# Patient Record
Sex: Female | Born: 1935 | Race: White | Hispanic: No | Smoking: Former smoker
Health system: Southern US, Community
[De-identification: ages and names within clinical notes are randomized; demographics above are authoritative.]

## PROBLEM LIST (undated history)

## (undated) DIAGNOSIS — E039 Hypothyroidism, unspecified: Secondary | ICD-10-CM

## (undated) DIAGNOSIS — H02139 Senile ectropion of unspecified eye, unspecified eyelid: Secondary | ICD-10-CM

## (undated) DIAGNOSIS — G459 Transient cerebral ischemic attack, unspecified: Secondary | ICD-10-CM

## (undated) DIAGNOSIS — I10 Essential (primary) hypertension: Secondary | ICD-10-CM

## (undated) DIAGNOSIS — E119 Type 2 diabetes mellitus without complications: Secondary | ICD-10-CM

## (undated) DIAGNOSIS — H02409 Unspecified ptosis of unspecified eyelid: Secondary | ICD-10-CM

## (undated) DIAGNOSIS — H16001 Unspecified corneal ulcer, right eye: Secondary | ICD-10-CM

## (undated) DIAGNOSIS — F329 Major depressive disorder, single episode, unspecified: Secondary | ICD-10-CM

## (undated) DIAGNOSIS — D496 Neoplasm of unspecified behavior of brain: Secondary | ICD-10-CM

## (undated) DIAGNOSIS — I259 Chronic ischemic heart disease, unspecified: Secondary | ICD-10-CM

## (undated) DIAGNOSIS — K219 Gastro-esophageal reflux disease without esophagitis: Secondary | ICD-10-CM

## (undated) DIAGNOSIS — F419 Anxiety disorder, unspecified: Secondary | ICD-10-CM

## (undated) DIAGNOSIS — C801 Malignant (primary) neoplasm, unspecified: Secondary | ICD-10-CM

## (undated) HISTORY — DX: Unspecified ptosis of unspecified eyelid: H02.409

## (undated) HISTORY — DX: Chronic ischemic heart disease, unspecified: I25.9

## (undated) HISTORY — PX: OTHER PROCEDURE: U1053

## (undated) HISTORY — PX: TUBAL LIGATION: SHX77

## (undated) HISTORY — DX: Type 2 diabetes mellitus without complications (CMS-HCC): E11.9

## (undated) HISTORY — DX: Hypothyroidism, unspecified: E03.9

## (undated) HISTORY — PX: CATARACT EXTRACTION W/ INTRAOCULAR LENS  IMPLANT, BILATERAL: SHX1307

## (undated) HISTORY — DX: Gastro-esophageal reflux disease without esophagitis: K21.9

## (undated) HISTORY — DX: Essential (primary) hypertension: I10

## (undated) HISTORY — DX: Major depressive disorder, single episode, unspecified: F32.9

## (undated) HISTORY — DX: Neoplasm of unspecified behavior of brain (CMS-HCC): D49.6

## (undated) HISTORY — DX: Transient cerebral ischemic attack, unspecified: G45.9

## (undated) HISTORY — PX: BELPHAROPTOSIS REPAIR: SHX369

## (undated) HISTORY — DX: Senile ectropion of unspecified eye, unspecified eyelid: H02.139

## (undated) HISTORY — DX: Unspecified corneal ulcer, right eye: H16.001

---

## 1988-06-29 HISTORY — PX: OTHER PROCEDURE: U1053

## 2000-06-29 DIAGNOSIS — G459 Transient cerebral ischemic attack, unspecified: Secondary | ICD-10-CM

## 2000-06-29 HISTORY — DX: Transient cerebral ischemic attack, unspecified: G45.9

## 2012-06-07 ENCOUNTER — Ambulatory Visit (INDEPENDENT_AMBULATORY_CARE_PROVIDER_SITE_OTHER): Payer: TRICARE For Life--Medicare Supplement | Admitting: Ophthalmology

## 2012-08-23 ENCOUNTER — Ambulatory Visit (INDEPENDENT_AMBULATORY_CARE_PROVIDER_SITE_OTHER): Payer: TRICARE For Life--Medicare Supplement | Admitting: Ophthalmology

## 2012-08-30 ENCOUNTER — Ambulatory Visit (INDEPENDENT_AMBULATORY_CARE_PROVIDER_SITE_OTHER): Payer: Medicare Other | Admitting: Ophthalmology

## 2013-09-12 ENCOUNTER — Ambulatory Visit (INDEPENDENT_AMBULATORY_CARE_PROVIDER_SITE_OTHER): Payer: Medicare Other | Admitting: Ophthalmology

## 2013-11-14 ENCOUNTER — Ambulatory Visit (INDEPENDENT_AMBULATORY_CARE_PROVIDER_SITE_OTHER): Payer: Medicare Other | Admitting: Ophthalmology

## 2013-11-21 ENCOUNTER — Ambulatory Visit (INDEPENDENT_AMBULATORY_CARE_PROVIDER_SITE_OTHER): Payer: Medicare Other | Admitting: Ophthalmology

## 2013-11-21 DIAGNOSIS — H02429 Myogenic ptosis of unspecified eyelid: Secondary | ICD-10-CM

## 2013-12-11 ENCOUNTER — Encounter (INDEPENDENT_AMBULATORY_CARE_PROVIDER_SITE_OTHER): Payer: Medicare Other | Admitting: Nurse Practitioner

## 2013-12-14 ENCOUNTER — Ambulatory Visit: Payer: Medicare Other | Attending: Family | Admitting: Family

## 2013-12-14 ENCOUNTER — Encounter (INDEPENDENT_AMBULATORY_CARE_PROVIDER_SITE_OTHER): Payer: Self-pay | Admitting: Family

## 2013-12-14 VITALS — BP 144/69 | HR 64 | Resp 16 | Wt 119.0 lb

## 2013-12-14 DIAGNOSIS — H02409 Unspecified ptosis of unspecified eyelid: Secondary | ICD-10-CM

## 2013-12-14 DIAGNOSIS — H02133 Senile ectropion of right eye, unspecified eyelid: Principal | ICD-10-CM

## 2013-12-14 DIAGNOSIS — H02139 Senile ectropion of unspecified eye, unspecified eyelid: Secondary | ICD-10-CM | POA: Insufficient documentation

## 2013-12-14 MED ORDER — POLYETHYL GLYCOL-PROPYL GLYCOL 0.4-0.3 % OP GEL: OPHTHALMIC | Status: AC

## 2013-12-14 MED ORDER — LEVOTHYROXINE SODIUM 50 MCG OR TABS: 50.00 ug | ORAL_TABLET | Freq: Every day | ORAL | Status: AC

## 2013-12-14 MED ORDER — TRAMADOL HCL 50 MG OR TABS: 50.00 mg | ORAL_TABLET | Freq: Four times a day (QID) | ORAL | Status: AC | PRN

## 2013-12-14 MED ORDER — SIMVASTATIN 20 MG OR TABS: 20.00 mg | ORAL_TABLET | Freq: Every evening | ORAL | Status: AC

## 2013-12-14 MED ORDER — SITAGLIPTIN PHOSPHATE 50 MG OR TABS
50.00 mg | ORAL_TABLET | Freq: Every day | ORAL | Status: DC
Start: ? — End: 2015-05-21

## 2013-12-14 MED ORDER — ESCITALOPRAM OXALATE 5 MG OR TABS: 5.00 mg | ORAL_TABLET | Freq: Every day | ORAL | Status: AC

## 2013-12-14 MED ORDER — ASPIRIN 81 MG OR TABS: 81.00 mg | ORAL_TABLET | Freq: Every day | ORAL | Status: AC

## 2013-12-14 MED ORDER — FOLIC ACID 1 MG OR TABS: 1.00 mg | ORAL_TABLET | Freq: Every day | ORAL | Status: AC

## 2013-12-14 MED ORDER — RANOLAZINE 500 MG OR TB12: 500.00 mg | ORAL_TABLET | Freq: Two times a day (BID) | ORAL | Status: AC

## 2013-12-14 MED ORDER — PANTOPRAZOLE SODIUM 40 MG OR TBEC: 40.00 mg | DELAYED_RELEASE_TABLET | Freq: Every day | ORAL | Status: AC

## 2013-12-14 MED ORDER — LOSARTAN POTASSIUM 50 MG OR TABS: 50.00 mg | ORAL_TABLET | Freq: Every day | ORAL | Status: AC

## 2013-12-14 MED ORDER — POLYETHYL GLYCOL-PROPYL GLYCOL 0.4-0.3 % OP SOLN: OPHTHALMIC | Status: AC

## 2013-12-14 NOTE — H&P (Signed)
Last edited 12/14/13 0958 by Delton Coombes, NP            ANESTHESIA PRE-OPERATIVE EVALUATION    Patient Information    Name: Krystal Berger    MRN: [id number]    DOB: 1932-05-26    Age: 80 year old    Sex: female  Procedure(s):  PTOSIS CORRECTION REPAIR OF EYELIDS 51884 RUL/ CANTHOPLASTY  67950 RUL       BP 144/69   Pulse 64   Resp 16   Wt 53.978 kg (119 lb)   SpO2 97%        Primary language spoken:  English    ROS/Medical History:  General Review & History of Anesthetic Complications:   Patient summary reviewed   No history of anesthetic complications, No PONV, No history of difficult intubation and no family history of anesthetic complications      Cardiovascular:    (+)  hypercholesterolemia  hypertension  CAD angina  cardiac stents unknown 2011      (-) PND, DOE    ECG reviewed   Pulmonary:     (+) shortness of breath (after 2 flights), , , , ,   (-) recent URI Hematology/Oncology:      Neuro/Psych:        (+) , psychiatric history (depression),    TIA: speech & memory slightly impaired.   Infectious Disease:            Endo/Other:      (+) diabetes mellitus type 2, hypothyroidism,  GI/Hepatic:     (+) GERD well controlled,    Renal:    - Negative ROS        Pregnancy History:      Currently pregnant? no       [facility]) Notes:  no beta blocker therapy  [facility] Test & records reviewed by [facility] provider  [facility] Echo/ECG/Angio test available.    Do not take any Diabetes medication including insulin the morning of surgery.Preoperative instructions and education given to patient:  NPO after midnight prior to surgery.  Take all BP and Heart medications on your regular schedule with a small sip of water.  You must have a driver to and from surgery. If you have a post op visit the day after surgery, you will need a driver for that appointment.  Bring all of your eye medications, a pair of dark/protective glasses, your white surgical folder in your blue Shiley bag on the day of surgery and for your post op  appointment.Daughter will assist  :  :              Physical Exam    Airway:  Inter-inciser distance 3-4 cm  Prognanth Unable    Mallampati: III  Neck ROM: full  Short thick neck: No    Cardiovascular:    Rhythm: regular   Rate: normal     Negative for peripheral edema     Pulmonary:       breath sounds clear to auscultation        Neuro/Neck/Skeletal/Skin:  - [place] ANE PHYS EXAM NEGATIVE ROS SKIN SKELETAL NEURO NECK    Positive for Skin abnormalities (ptosi od some facial abnormality since brain surgery)      Dental:    Comment: Upper front capped    Abdominal:   - normal exam         Additional Clinical Notes:   Other Physical Exam Findings: Poor historian not interested in health hx  Snores                      Past Medical History   Diagnosis Date    Senile ectropion     Unspecified ptosis of eyelid     Ischemic heart disease     Brain tumor     Hypertension     Diabetes mellitus     Corneal melt, right     TIA (transient ischemic attack) 2002     x 2    Hypothyroidism     GERD (gastroesophageal reflux disease)     Hypertension     Major depressive disorder, single episode      Past Surgical History   Procedure Laterality Date    Benign brain tumor   1990    Reconstruction face with nerve graft secondary to brain surgery      Cardiac stent type unknown 2001      Cataract extraction w/ intraocular lens  implant, bilateral Bilateral     Biopsy rt breast       Tubal ligation       History   Substance Use Topics    Smoking status: Former Smoker    Smokeless tobacco: Not on file    Alcohol Use: No       Current Outpatient Prescriptions   Medication Sig Dispense Refill    aspirin 81 MG tablet Take 81 mg by mouth daily.      escitalopram (LEXAPRO) 5 MG tablet Take 5 mg by mouth daily.      folic acid (FOLVITE) 1 MG tablet Take 1 mg by mouth daily.      levothyroxine (SYNTHROID) 50 MCG tablet Take 50 mcg by mouth every morning (before breakfast).      losartan (COZAAR) 50 MG tablet Take 50 mg by  mouth daily.      pantoprazole (PROTONIX) 40 MG tablet Take 40 mg by mouth daily.      Polyethyl Glycol-Propyl Glycol (SYSTANE) 0.4-0.3 % GEL       Polyethyl Glycol-Propyl Glycol (SYSTANE) 0.4-0.3 % SOLN       ranolazine (RANEXA) 500 MG Sustained-Release tablet Take 500 mg by mouth 2 times daily.      simvastatin (ZOCOR) 20 MG tablet Take 20 mg by mouth every evening.      sitagliptan (JANUVIA) 50 MG tablet Take 50 mg by mouth daily.      traMADol (ULTRAM) 50 MG tablet Take 50 mg by mouth every 6 hours as needed for Moderate Pain (Pain Score 4-6).       No current facility-administered medications for this visit.     Allergies   Allergen Reactions    Bromday Other     Corneal melt      Codeine Unspecified       Labs and Other Data  No results found for: NA, K, CL, BICARB, BUN, CREAT, GLU, [place]  No results found for: AST, ALT, GGT, LDH, ALK, TP, ALB, TBILI, DBILI  No results found for: WBC, RBC, HGB, HCT, MCV, MCHC, RDW, PLT, MPV, SEG, LYMPHS, MONOS, EOS, BASOS  No results found for: INR, PTT  No results found for: ARTPH, ARTPO2, ARTPCO2    Anesthesia Plan

## 2013-12-14 NOTE — Anesthesia Preprocedure Evaluation (Addendum)
ANESTHESIA PRE-OPERATIVE EVALUATION    Patient Information    Name: Krystal Berger    MRN: [id number]    DOB: 1931/12/20    Age: 80 year old    Sex: female  Procedure(s):  [place] [address] RUL/ CANTHOPLASTY  67950 RUL       BP 144/69   Pulse 64   Resp 16   Wt 53.978 kg (119 lb)   SpO2 97%        Primary language spoken:  English    ROS/Medical History:  General Review & History of Anesthetic Complications:   Patient summary reviewed   No history of anesthetic complications, No PONV, No history of difficult intubation and no family history of anesthetic complications      Cardiovascular:    Exercise tolerance: >4 METS  (+)  hypercholesterolemia  hypertension  CAD angina  cardiac stents unknown 2011      (-) PND, DOE    ECG reviewed   Pulmonary:     (+) shortness of breath (after 2 flights), , , , ,   (-) recent URI Hematology/Oncology:      Neuro/Psych:        (+) TIA (speech & memory slightly impaired), , psychiatric history (depression),      ROS Comments: H/0 brain tumor bening removed in 1990  H/o TIA 5 years ago Infectious Disease:            Endo/Other:      (+) diabetes mellitus type 2, hypothyroidism,  GI/Hepatic:     (+) GERD well controlled,    Renal:    - Negative ROS        Pregnancy History:      Currently pregnant? no       [facility]) Notes:  no beta blocker therapy  [facility] Test & records reviewed by [facility] provider  [facility] Echo/ECG/Angio test available.    Do not take any Diabetes medication including insulin the morning of surgery.Preoperative instructions and education given to patient:  NPO after midnight prior to surgery.  Take all BP and Heart medications on your regular schedule with a small sip of water.  You must have a driver to and from surgery. If you have a post op visit the day after surgery, you will need a driver for that appointment.  Bring all of your eye medications, a pair of dark/protective glasses, your white surgical folder in your blue Shiley bag on the  day of surgery and for your post op appointment.Daughter will assist  :  :      Do not take any Diabetes medication including insulin the morning of surgery.Preoperative instructions and education given to patient:  NPO after midnight prior to surgery.  Take all BP and Heart medications on your regular schedule with a small sip of water.  You must have a driver to and from surgery. If you have a post op visit the day after surgery, you will need a driver for that appointment.  Bring all of your eye medications, a pair of dark/protective glasses, your white surgical folder in your blue Shiley bag on the day of surgery and for your post op appointment.Daughter will assist  :          Physical Exam    Airway:  Inter-inciser distance 3-4 cm  Prognanth Unable    Mallampati: III  Neck ROM: full  Short thick neck: No    Cardiovascular:    Rhythm: regular  Rate: normal     Negative for peripheral edema     Pulmonary:       breath sounds clear to auscultation        Neuro/Neck/Skeletal/Skin:  - [place] ANE PHYS EXAM NEGATIVE ROS SKIN SKELETAL NEURO NECK    Positive for Skin abnormalities (ptosi od some facial abnormality since brain surgery)      Dental:    Comment: Upper front capped    Abdominal:   - normal exam         Additional Clinical Notes:   Other Physical Exam Findings: Poor historian not interested in health hx  Snores                      Past Medical History   Diagnosis Date   • Senile ectropion    • Unspecified ptosis of eyelid    • Ischemic heart disease    • Brain tumor    • Hypertension    • Diabetes mellitus    • Corneal melt, right    • TIA (transient ischemic attack) 2002     x 2   • Hypothyroidism    • GERD (gastroesophageal reflux disease)    • Hypertension    • Major depressive disorder, single episode      Past Surgical History   Procedure Laterality Date   • Benign brain tumor   1990   • Reconstruction face with nerve graft secondary to brain surgery     • Cardiac stent type unknown 2001     • Cataract  extraction w/ intraocular lens  implant, bilateral Bilateral    • Biopsy rt breast      • Tubal ligation       History   Substance Use Topics   • Smoking status: Former Smoker   • Smokeless tobacco: Not on file   • Alcohol Use: No       Current Outpatient Prescriptions   Medication Sig Dispense Refill   • aspirin 81 MG tablet Take 81 mg by mouth daily.     • escitalopram (LEXAPRO) 5 MG tablet Take 5 mg by mouth daily.     • folic acid (FOLVITE) 1 MG tablet Take 1 mg by mouth daily.     • levothyroxine (SYNTHROID) 50 MCG tablet Take 50 mcg by mouth every morning (before breakfast).     • losartan (COZAAR) 50 MG tablet Take 50 mg by mouth daily.     • pantoprazole (PROTONIX) 40 MG tablet Take 40 mg by mouth daily.     • Polyethyl Glycol-Propyl Glycol (SYSTANE) 0.4-0.3 % GEL      • Polyethyl Glycol-Propyl Glycol (SYSTANE) 0.4-0.3 % SOLN      • ranolazine (RANEXA) 500 MG Sustained-Release tablet Take 500 mg by mouth 2 times daily.     • simvastatin (ZOCOR) 20 MG tablet Take 20 mg by mouth every evening.     • sitagliptan (JANUVIA) 50 MG tablet Take 50 mg by mouth daily.     • traMADol (ULTRAM) 50 MG tablet Take 50 mg by mouth every 6 hours as needed for Moderate Pain (Pain Score 4-6).       No current facility-administered medications for this visit.     Allergies   Allergen Reactions   • Bromday Other     Corneal melt     • Codeine Unspecified       Labs and Other Data  No results found for:   NA, K, CL, BICARB, BUN, CREAT, GLU, [place]  No results found for: AST, ALT, GGT, LDH, ALK, TP, ALB, TBILI, DBILI  No results found for: WBC, RBC, HGB, HCT, MCV, MCHC, RDW, PLT, MPV, SEG, LYMPHS, MONOS, EOS, BASOS  No results found for: INR, PTT  No results found for: ARTPH, ARTPO2, ARTPCO2    Anesthesia Plan:  ASA 3 (Severe systemic disease)     Planned induction method: MAC (Sedation)   Planned monitoring method: Routine monitoring  Comments:      Anesthetic plan and risks discussed with Patient.

## 2013-12-18 ENCOUNTER — Encounter (HOSPITAL_BASED_OUTPATIENT_CLINIC_OR_DEPARTMENT_OTHER)
Admission: RE | Disposition: A | Discharge status: Home Health | Payer: Medicare Other | Source: Ambulatory Visit | Attending: Ophthalmology

## 2013-12-18 ENCOUNTER — Ambulatory Visit
Admission: RE | Admit: 2013-12-18 | Discharge: 2013-12-18 | Disposition: A | Discharge status: Home / Self Care | Payer: Medicare Other | Source: Ambulatory Visit | Attending: Ophthalmology | Admitting: Ophthalmology

## 2013-12-18 ENCOUNTER — Encounter (HOSPITAL_BASED_OUTPATIENT_CLINIC_OR_DEPARTMENT_OTHER): Payer: Medicare Other | Admitting: Family

## 2013-12-18 ENCOUNTER — Ambulatory Visit (HOSPITAL_BASED_OUTPATIENT_CLINIC_OR_DEPARTMENT_OTHER): Payer: Medicare Other | Admitting: Ophthalmology

## 2013-12-18 ENCOUNTER — Ambulatory Visit (HOSPITAL_BASED_OUTPATIENT_CLINIC_OR_DEPARTMENT_OTHER): Payer: Medicare Other | Admitting: Anesthesiology

## 2013-12-18 DIAGNOSIS — H02409 Unspecified ptosis of unspecified eyelid: Principal | ICD-10-CM | POA: Insufficient documentation

## 2013-12-18 DIAGNOSIS — H02133 Senile ectropion of right eye, unspecified eyelid: Secondary | ICD-10-CM

## 2013-12-18 DIAGNOSIS — H02109 Unspecified ectropion of unspecified eye, unspecified eyelid: Secondary | ICD-10-CM | POA: Insufficient documentation

## 2013-12-18 LAB — GLUCOSE (POCT)
Glucose (POCT): 96 mg/dL (ref 70–115)
Glucose (POCT): 95 mg/dL (ref 70–115)

## 2013-12-18 SURGERY — REPAIR, BLEPHAROPTOSIS, BILATERAL
Anesthesia: Monitored Anesthesia Care (MAC) | Site: Eye | Laterality: Right

## 2013-12-18 MED ORDER — ONDANSETRON HCL 4 MG/2ML IV SOLN
4.0000 mg | Freq: Once | INTRAMUSCULAR | Status: DC | PRN
Start: 2013-12-18 — End: 2013-12-18
  Filled 2013-12-18: qty 2

## 2013-12-18 MED ORDER — TETRACAINE HCL 0.5 % OP SOLN
OPHTHALMIC | Status: DC | PRN
Start: 2013-12-18 — End: 2013-12-18
  Administered 2013-12-18: 2 [drp] via OPHTHALMIC

## 2013-12-18 MED ORDER — LIDOCAINE-EPINEPHRINE 2 %-1:100000 IJ SOLN
INTRAMUSCULAR | Status: DC | PRN
Start: 2013-12-18 — End: 2013-12-18
  Administered 2013-12-18: 5.5 mL
  Administered 2013-12-18: .5 mL

## 2013-12-18 MED ORDER — MIDAZOLAM HCL 2 MG/2ML IJ SOLN
INTRAMUSCULAR | Status: DC | PRN
Start: 2013-12-18 — End: 2013-12-18
  Administered 2013-12-18 (×3): 1 mg via INTRAVENOUS

## 2013-12-18 MED ORDER — LIDOCAINE HCL (CARDIAC) 20 MG/ML IV SOLN
INTRAVENOUS | Status: DC | PRN
Start: 2013-12-18 — End: 2013-12-18
  Administered 2013-12-18: 20 mg via INTRAVENOUS

## 2013-12-18 MED ORDER — PROPOFOL 10 MG/ML IV EMUL
INTRAVENOUS | Status: DC | PRN
Start: 2013-12-18 — End: 2013-12-18
  Administered 2013-12-18: 25 ug/kg/min via INTRAVENOUS

## 2013-12-18 MED ORDER — PROPOFOL IV BOLUS 10 MG/ML
INTRAVENOUS | Status: DC | PRN
Start: 2013-12-18 — End: 2013-12-18
  Administered 2013-12-18: 20 mg via INTRAVENOUS

## 2013-12-18 MED ORDER — LACTATED RINGERS IV SOLN
INTRAVENOUS | Status: DC | PRN
Start: 2013-12-18 — End: 2013-12-18
  Administered 2013-12-18: 12:00:00 via INTRAVENOUS

## 2013-12-18 MED ORDER — ACETAMINOPHEN 325 MG PO TABS
650.0000 mg | ORAL_TABLET | Freq: Once | ORAL | Status: DC | PRN
Start: 2013-12-18 — End: 2013-12-18
  Filled 2013-12-18: qty 2

## 2013-12-18 MED ORDER — FENTANYL CITRATE 0.05 MG/ML IJ SOLN
12.5000 ug | INTRAMUSCULAR | Status: DC | PRN
Start: 2013-12-18 — End: 2013-12-18
  Filled 2013-12-18: qty 0.25

## 2013-12-18 MED ORDER — FENTANYL CITRATE 0.05 MG/ML IJ SOLN
INTRAMUSCULAR | Status: DC | PRN
Start: 2013-12-18 — End: 2013-12-18
  Administered 2013-12-18: 25 ug via INTRAVENOUS

## 2013-12-18 MED ORDER — NEOMYCIN-POLYMYXIN-DEXAMETH 3.5-10000-0.1 OP OINT
1.0000 | TOPICAL_OINTMENT | Freq: Two times a day (BID) | OPHTHALMIC | Status: AC
Start: 2013-12-18 — End: 2014-01-01

## 2013-12-18 MED ORDER — POVIDONE-IODINE 5 % OP SOLN
OPHTHALMIC | Status: DC | PRN
Start: 2013-12-18 — End: 2013-12-18
  Administered 2013-12-18: 15 [drp] via OPHTHALMIC

## 2013-12-18 MED ORDER — NEOMYCIN-POLYMYXIN-DEXAMETH 3.5-10000-0.1 OP OINT
TOPICAL_OINTMENT | OPHTHALMIC | Status: DC | PRN
Start: 2013-12-18 — End: 2013-12-18
  Administered 2013-12-18 (×3): 1 via OPHTHALMIC

## 2013-12-18 MED ORDER — NALOXONE HCL 0.4 MG/ML IJ SOLN
20.0000 ug | INTRAMUSCULAR | Status: DC | PRN
Start: 2013-12-18 — End: 2013-12-18
  Filled 2013-12-18: qty 0.05

## 2013-12-18 MED ORDER — FENTANYL CITRATE 0.05 MG/ML IJ SOLN
25.0000 ug | INTRAMUSCULAR | Status: DC | PRN
Start: 2013-12-18 — End: 2013-12-18
  Filled 2013-12-18: qty 0.5

## 2013-12-18 SURGICAL SUPPLY — 20 items
CANNULA NASAL FLARED W/7' TUBE (Misc Medical Supply) ×2 IMPLANT
GLOVE BIOGEL PI ULTRATOUCH SIZE 7.5 (Drape/Gowns/Gloves/Pack) ×2
GLOVE BIOGEL SUPER-SENSITIVE SIZE 6.5 (Drape/Gowns/Gloves/Pack) ×2 IMPLANT
GLOVE BIOGEL SUPER-SENSITIVE SIZE 7 (Drape/Gowns/Gloves/Pack) ×2 IMPLANT
GLOVE BIOGEL SUPER-SENSITIVE SIZE 7.5 (Drape/Gowns/Gloves/Pack) ×2 IMPLANT
GOWN SURGICAL ULTRA XL BLUE, AAMI LVL 3 (Drape/Gowns/Gloves/Pack) ×4 IMPLANT
NEEDLE BD HYPO 27G X 1.25" (Needles/punch/cannula/biopsy) ×2
NEEDLE PROTECT 18G X 1.5" (Needles/punch/cannula/biopsy) ×2
PACK OCULARPLASTY (Drape/Gowns/Gloves/Pack) ×2 IMPLANT
PROTECTOR CORNEAL CROUCH (Misc Medical Supply)
SKIN MARKER SURGICAL, EXTRA FINE (Misc Medical Supply) ×2 IMPLANT
SOLUTION IRR POUR BTL .9% N/S 250 (Non-Pharmacy Meds/Solutions) ×2 IMPLANT
SOLUTION IRR POUR BTL H20 250ML (Non-Pharmacy Meds/Solutions) ×2 IMPLANT
STOPCOCK 33 EXT LEURLOCK (Kits/Sets/Trays) IMPLANT
SUTURE PERMA-HAND SILK 6-0 18" 765 (Suture) IMPLANT
SUTURE PLAIN GUT 6-0 18" PC-1 1916, FAST ABSORB (Suture) ×2 IMPLANT
SUTURE PROLENE P-1 6-0 (Suture) ×2 IMPLANT
SUTURE VICRYL 5-0 18 P-3 (Suture) ×2 IMPLANT
SUTURE VICRYL 7-0 12" TG140-8 (Suture) ×2
SYRINGE HYPO LL 10CC (Needles/punch/cannula/biopsy) ×2 IMPLANT

## 2013-12-18 NOTE — Op Note (Signed)
DATE: 12/18/2013     PREOPERATIVE DIAGNOSIS:   1. Right upper lid ptosis   2. Right lower lid paralytic ectropion     POSTOPERATIVE DIAGNOSIS:   1. Right upper lid ptosis   2. Right lower lid paralytic ectropion     PROCEDURE:   1. Right upper lid ptosis repair via external levator advancement   2. Right lower lid paralytic ectropion repair via canthoplasty     ATTENDING SURGEON: Gennaro Africa, MD     ASSISTANTS(s): Martie Round, MD; Wylene Simmer, MD, PhD    NOTE: Please note Dr. Gilles Chiquito, the attending surgeon, was present, scrubbed and attended the entire procedure.    INDICATIONS: This patient presented to the eye clinic complaining of decreased superior visual fields in right eye following opening of a lateral tarsorraphy for facial nerve palsy.  The patient also has a saggy lower lid due to paralytic ectropion of the right lower lid. The patient must manually lift the eyelid to see, and this was affecting activities of daily living such as reading and driving. On clinical examination, the patient was noted to have right upper eyelid ptosis. On manual elevation of the upper eyelid, there was a greater than 30 degree improvement in superior visual field. The risks, benefits, and alternatives to procedure were discussed with the patient in detail, including the risk of bleeding, infection, death, vision loss, blindness, asymmetry, cosmetic deformity, dry eye syndrome, lagophthalmos, eyelid retraction, eyelid malposition, double vision, need for further surgery, and no improvement. All questions were answered and the patient elected to proceed with surgery.    DESCRIPTION OF PROCEDURE: After proper patient identification, informed consent was obtained and the right side was marked for surgery. The patient was taken back to the operating room. Under IV sedation and monitoring, 2% lidocaine with epinephrine mixed with 0.75% Marcaine was infiltrated into the right upper lid along the natural eyelid crease. The patient  was then prepped and draped in the usual sterile manner for oculoplastic surgery.     Attention was directed to the right upper eyelid where a 15 blade was used to make an eyelid crease incision. Bovie cautery was used to dissect superiorly to open the orbital septum and identify the levator aponeurosis. Dissection was carried down to the tarsus in a pre-tarsal plane using Bovie and Westcott scissors. A 6-0 prolene suture was passed in a horizontal mattress fashion through the levator, through the tarsus in a lamellar fashion, and back up through the levator aponeurosis. This was tied off in a temporary fashion, and the lid level was checked and there was noted to be a natural contour and good lid height. The orbicularis was closed with 7-0 vicryl suture, and the skin was closed in running fashion using a 6-0 fast-absorbing gut suture. This completed the right upper eyelid ptosis repair by external levator advancement.    Attention was shifted to the right lateral canthus where local anesthetic was injected.  Westcott scissors were used to perform a lateral canthotomy and inferior cantholysis.  Electrocautery was used for hemostasis. The lid was horizontally shortened via full-thickness excision of a wedge of tissue.  A new canthal tendon was then created.  The canthus was reconstructed with a 5-0 vicryl suture passed through the lateral tarsus and anchored to the superior crus of the lateral canthal tendon.  This complex was then reinserted just inside the lateral orbital rim on the periosteum.  The skin was closed with interrupted 6-0 fast absorbing gut suture.  This completed ;  ateral canthoplasty.    At the conclusion of the case, the corneal protectors were removed and ophthalmic antibiotic ointment was placed in both eyes. The patient tolerated the procedure well and was taken to the recovery room in stable condition. The patient will apply ophthalmic antibiotic ointment to both eyes BID for one week and will  follow up in the oculoplastics clinic in one week.

## 2013-12-18 NOTE — Brief Op Note (Signed)
OPHTHALMOLOGY BRIEF OPERATIVE NOTE    DATE: 12/18/2013  TIME: 12:39 PM    PREOPERATIVE DIAGNOSIS:   1. Right upper lid ptosis  2. Right lower lid paralytic ectropion    POSTOPERATIVE DIAGNOSIS:   1. Right upper lid ptosis  2. Right lower lid paralytic ectropion    PROCEDURE:   1. Right upper lid ptosis repair via external levator advancement  2. Right lower lid paralytic ectropion repair via canthoplasty and canthal tendon plication    ATTENDING SURGEON: Gennaro Africa, MD  ASSISTANTS(s): Martie Round, MD; Wylene Simmer, MD, PhD    ANESTHESIA: MAC    COMPLICATIONS:  None    SPECIMENS:  None    DISPOSITION: home

## 2013-12-18 NOTE — Plan of Care (Signed)
Problem: Potential for injury related to level of consciousness, medication, age, increased fall risk, and mobility  Goal: Patient will remain injury free  Outcome: Met

## 2013-12-18 NOTE — H&P (View-Only) (Signed)
Last edited 12/14/13 0958 by Delton Coombes, NP            ANESTHESIA PRE-OPERATIVE EVALUATION    Patient Information    Name: Krystal Berger    MRN: [id number]    DOB: Mar 12, 1932    Age: 80 year old    Sex: female  Procedure(s):  PTOSIS CORRECTION REPAIR OF EYELIDS 93235 RUL/ CANTHOPLASTY  67950 RUL       BP 144/69   Pulse 64   Resp 16   Wt 53.978 kg (119 lb)   SpO2 97%        Primary language spoken:  English    ROS/Medical History:  General Review & History of Anesthetic Complications:   Patient summary reviewed   No history of anesthetic complications, No PONV, No history of difficult intubation and no family history of anesthetic complications      Cardiovascular:    (+)  hypercholesterolemia  hypertension  CAD angina  cardiac stents unknown 2011      (-) PND, DOE    ECG reviewed   Pulmonary:     (+) shortness of breath (after 2 flights), , , , ,   (-) recent URI Hematology/Oncology:      Neuro/Psych:        (+) , psychiatric history (depression),    TIA: speech & memory slightly impaired.   Infectious Disease:            Endo/Other:      (+) diabetes mellitus type 2, hypothyroidism,  GI/Hepatic:     (+) GERD well controlled,    Renal:    - Negative ROS        Pregnancy History:      Currently pregnant? no       [facility]) Notes:  no beta blocker therapy  [facility] Test & records reviewed by [facility] provider  [facility] Echo/ECG/Angio test available.    Do not take any Diabetes medication including insulin the morning of surgery.Preoperative instructions and education given to patient:  NPO after midnight prior to surgery.  Take all BP and Heart medications on your regular schedule with a small sip of water.  You must have a driver to and from surgery. If you have a post op visit the day after surgery, you will need a driver for that appointment.  Bring all of your eye medications, a pair of dark/protective glasses, your white surgical folder in your blue Shiley bag on the day of surgery and for your post op  appointment.Daughter will assist  :  :              Physical Exam    Airway:  Inter-inciser distance 3-4 cm  Prognanth Unable    Mallampati: III  Neck ROM: full  Short thick neck: No    Cardiovascular:    Rhythm: regular   Rate: normal     Negative for peripheral edema     Pulmonary:       breath sounds clear to auscultation        Neuro/Neck/Skeletal/Skin:  - [place] ANE PHYS EXAM NEGATIVE ROS SKIN SKELETAL NEURO NECK    Positive for Skin abnormalities (ptosi od some facial abnormality since brain surgery)      Dental:    Comment: Upper front capped    Abdominal:   - normal exam         Additional Clinical Notes:   Other Physical Exam Findings: Poor historian not interested in health hx  Snores                      Past Medical History   Diagnosis Date    Senile ectropion     Unspecified ptosis of eyelid     Ischemic heart disease     Brain tumor     Hypertension     Diabetes mellitus     Corneal melt, right     TIA (transient ischemic attack) 2002     x 2    Hypothyroidism     GERD (gastroesophageal reflux disease)     Hypertension     Major depressive disorder, single episode      Past Surgical History   Procedure Laterality Date    Benign brain tumor   1990    Reconstruction face with nerve graft secondary to brain surgery      Cardiac stent type unknown 2001      Cataract extraction w/ intraocular lens  implant, bilateral Bilateral     Biopsy rt breast       Tubal ligation       History   Substance Use Topics    Smoking status: Former Smoker    Smokeless tobacco: Not on file    Alcohol Use: No       Current Outpatient Prescriptions   Medication Sig Dispense Refill    aspirin 81 MG tablet Take 81 mg by mouth daily.      escitalopram (LEXAPRO) 5 MG tablet Take 5 mg by mouth daily.      folic acid (FOLVITE) 1 MG tablet Take 1 mg by mouth daily.      levothyroxine (SYNTHROID) 50 MCG tablet Take 50 mcg by mouth every morning (before breakfast).      losartan (COZAAR) 50 MG tablet Take 50 mg by  mouth daily.      pantoprazole (PROTONIX) 40 MG tablet Take 40 mg by mouth daily.      Polyethyl Glycol-Propyl Glycol (SYSTANE) 0.4-0.3 % GEL       Polyethyl Glycol-Propyl Glycol (SYSTANE) 0.4-0.3 % SOLN       ranolazine (RANEXA) 500 MG Sustained-Release tablet Take 500 mg by mouth 2 times daily.      simvastatin (ZOCOR) 20 MG tablet Take 20 mg by mouth every evening.      sitagliptan (JANUVIA) 50 MG tablet Take 50 mg by mouth daily.      traMADol (ULTRAM) 50 MG tablet Take 50 mg by mouth every 6 hours as needed for Moderate Pain (Pain Score 4-6).       No current facility-administered medications for this visit.     Allergies   Allergen Reactions    Bromday Other     Corneal melt      Codeine Unspecified       Labs and Other Data  No results found for: NA, K, CL, BICARB, BUN, CREAT, GLU, [place]  No results found for: AST, ALT, GGT, LDH, ALK, TP, ALB, TBILI, DBILI  No results found for: WBC, RBC, HGB, HCT, MCV, MCHC, RDW, PLT, MPV, SEG, LYMPHS, MONOS, EOS, BASOS  No results found for: INR, PTT  No results found for: ARTPH, ARTPO2, ARTPCO2    Anesthesia Plan

## 2013-12-18 NOTE — Anesthesia Postprocedure Evaluation (Signed)
Anesthesia Transfer of Care Note    Patient: Chiquitta Matty    Procedures performed: Procedure(s):  PTOSIS CORRECTION REPAIR, RIGHT UPPER LID, ECTROPION REPAIR, RIGHT LOWER LID, LATERAL CANTHOPLASTY, RIGHT EYE      Vital signs: stable           Anesthesia Post Note    Patient: Tamira Ryland    Procedure(s) Performed: Procedure(s):  PTOSIS CORRECTION REPAIR, RIGHT UPPER LID, ECTROPION REPAIR, RIGHT LOWER LID, LATERAL CANTHOPLASTY, RIGHT EYE        Final anesthesia type: MAC (Sedation)    Patient location: PACU    Post anesthesia pain: adequate analgesia    Post assessment: no apparent anesthetic complications, tolerated procedure well and no evidence of recall    Mental status: awake, alert  and oriented    Airway Patent: Yes    Last Vitals:   Filed Vitals:    12/18/13 1241   BP: 147/50   Pulse: 63   Temp: 36.8 C   Resp: 10   SpO2: 100%       Post vital signs: stable    Hydration: adequate    N/V:no    Anesthetic complications: no    Disposal of controlled substances: All controlled substances during the case accounted for and disposed of per hospital policy    Plan of care per primary team.

## 2013-12-18 NOTE — Interval H&P Note (Signed)
Attending Attestation of H&P:    I agree with the content of the H&P as stated: yes  I have corrections or additions as indicated below: no    History & Physical - Interval Assessment:    Jocee Kissick  [id number]    Current medical status:  Unchanged    Medications/allergies:   Unchanged    Review of symptoms:  Unchanged    Physical examination:  I have examined the patient today.  Unchanged    Laboratory or clinical data:  Unchanged    Modifications of initial care plan:  Unchanged      Romie Minus 12/18/2013  11:45 AM

## 2013-12-18 NOTE — Plan of Care (Signed)
Problem: Alteration in comfort related to pain  Goal: Patient will verbalize or exhibit feelings of comfort and/or decrease in pain, vital signs will remain within parameters ordered by physician/ provider  Outcome: Met

## 2013-12-18 NOTE — Discharge Instructions (Signed)
AFTER YOUR OCULOFACIAL PLASTIC AND RECONSTRUCTIVE SURGERY  Patient Information    Thank you for allowing us to be part of your healthcare team.  We look forward to continuing your care the post-operative period.  Before leaving, you should have a return appointment either at the [facility] or at the [facility] in [place].  An appointment care will be given to you as a reminder.    The following are recommendations to make your recovery more comfortable and safe.    Call your doctor if:    -  You experience severe pain or swelling with inability to open eyes.    -  You have a sudden increase in redness and pus from the eye.    -  You have increased blurring or dimming of your vision.    -  You have bleeding that is excessive or does not stop.    -  You have flashing lights or a dark curtain coming down over your vision.    -  You have a deep aching pain in your eye that is not relieved by Tylenol.    -  You have a temperature greater than 101 degrees.    Diet:  Resume your normal diet today.    Ice Packs:  You have been provided with an ice pack.  Use it on the way home and for the first 72 hours after surgery.  Use it for twenty (20) minutes on and twenty minutes off while you are awake.  Use crushed ice if possible, or at least use very small cubes.  You must have a very moist gauze pad on the area prior to putting on the ice packs.    Eye Patch:  You do not need an eye patch following your procedure.  Use your prescribed eye drops and/or ointment as directed.    Swelling and Bruising:  Expect some swelling and black and blue discoloration.  This swelling and discoloration may, because of gravity, tend to collect in the lower eyelids or cheeks.  You may also experience tearing, sensitivity to light, itching, bloody tears, or tightness of the eyelids.  These sensations should gradually improve day to day.    Stitches:  All stitches should be kept clean.  Your surgeon will discuss timing of  removal if necessary.    Medications:  You may take Regular Strength Tylenol (acetaminophen) as directed on the bottle as needed for any pain or discomfort.  Resume all other medications that you were previously taking unless otherwise instructed.  Do not take any pain relievers containing aspirin for the first week after surgery.     1. Maxitrol ophthalmic ointment (neo-poly-dex).  Apply small amount to eyelid incisions 2x/day for 2 weeks    Vision  Your vision immediately after surgery may be blurry for many reasons; do not be alarmed.  However, if at any time after surgery your vision gets worse, you experience severe pain, or swelling with inability to open your eyes, contact your surgeon immediately.    Important Telephone Numbers      [facility] . . . . . . . .  . . . . . . . . . . .  . . . . . . . . . . . [phone]    Clinic hours are 8:00 a.m. to 4:30 p.m., Monday through Friday     [facility] (3rd Floor) . . . . . . . . . . . . . . . . . . . . (  [phone]     [facility] . . . . . . . . . . . . . . . . . . . . . . . . . . . . . . [phone]   Clinic hours are:  8:00 a.m. to 12 noon on Mondays         8:00 a.m. to 4:30 p.m. Tuesday through Friday     [facility]  . . . . . . . . . . . . . . . . . . . . . . . . . . [phone]   Ask to speak to the Ophthalmologist on-call.

## 2013-12-26 ENCOUNTER — Ambulatory Visit (INDEPENDENT_AMBULATORY_CARE_PROVIDER_SITE_OTHER): Payer: Medicare Other | Admitting: Ophthalmology

## 2013-12-26 DIAGNOSIS — Z09 Encounter for follow-up examination after completed treatment for conditions other than malignant neoplasm: Secondary | ICD-10-CM

## 2014-01-25 ENCOUNTER — Ambulatory Visit (INDEPENDENT_AMBULATORY_CARE_PROVIDER_SITE_OTHER): Payer: Medicare Other | Admitting: Ophthalmology

## 2014-01-25 DIAGNOSIS — G245 Blepharospasm: Secondary | ICD-10-CM

## 2014-02-13 ENCOUNTER — Ambulatory Visit (INDEPENDENT_AMBULATORY_CARE_PROVIDER_SITE_OTHER): Payer: Medicare Other | Admitting: Ophthalmology

## 2014-02-13 DIAGNOSIS — Z09 Encounter for follow-up examination after completed treatment for conditions other than malignant neoplasm: Secondary | ICD-10-CM

## 2014-02-13 DIAGNOSIS — Z9889 Other specified postprocedural states: Secondary | ICD-10-CM

## 2014-06-19 ENCOUNTER — Ambulatory Visit (INDEPENDENT_AMBULATORY_CARE_PROVIDER_SITE_OTHER): Payer: Medicare Other | Admitting: Ophthalmology

## 2014-06-19 DIAGNOSIS — H53133 Sudden visual loss, bilateral: Secondary | ICD-10-CM

## 2014-06-27 ENCOUNTER — Ambulatory Visit (INDEPENDENT_AMBULATORY_CARE_PROVIDER_SITE_OTHER): Payer: Medicare Other | Admitting: Ophthalmology

## 2014-06-27 DIAGNOSIS — H53123 Transient visual loss, bilateral: Secondary | ICD-10-CM

## 2014-06-27 DIAGNOSIS — H49881 Other paralytic strabismus, right eye: Secondary | ICD-10-CM

## 2014-11-20 ENCOUNTER — Ambulatory Visit (INDEPENDENT_AMBULATORY_CARE_PROVIDER_SITE_OTHER): Payer: Medicare Other | Admitting: Ophthalmology

## 2014-11-20 DIAGNOSIS — H02421 Myogenic ptosis of right eyelid: Secondary | ICD-10-CM

## 2014-11-20 DIAGNOSIS — H02132 Senile ectropion of right lower eyelid: Secondary | ICD-10-CM

## 2015-05-07 ENCOUNTER — Ambulatory Visit (INDEPENDENT_AMBULATORY_CARE_PROVIDER_SITE_OTHER): Payer: Medicare Other | Admitting: Ophthalmology

## 2015-05-07 ENCOUNTER — Encounter (INDEPENDENT_AMBULATORY_CARE_PROVIDER_SITE_OTHER): Payer: Medicare Other | Admitting: Family

## 2015-05-07 DIAGNOSIS — H02421 Myogenic ptosis of right eyelid: Secondary | ICD-10-CM

## 2015-05-21 ENCOUNTER — Ambulatory Visit: Payer: Medicare Other | Attending: Nurse Practitioner | Admitting: Nurse Practitioner

## 2015-05-21 ENCOUNTER — Encounter (INDEPENDENT_AMBULATORY_CARE_PROVIDER_SITE_OTHER): Payer: Self-pay | Admitting: Nurse Practitioner

## 2015-05-21 VITALS — BP 145/57 | HR 63 | Resp 16 | Ht 62.5 in | Wt 119.4 lb

## 2015-05-21 DIAGNOSIS — H02401 Unspecified ptosis of right eyelid: Secondary | ICD-10-CM

## 2015-05-21 NOTE — H&P (Signed)
Last edited 05/21/15 1047 by Garth Bigness., NP             ANESTHESIA PRE-OPERATIVE EVALUATION    Patient Information    Name: Krystal Berger    MRN: [id number]    DOB: Oct 10, 1931    Age: 81 year old    Sex: female  Procedure(s):  REPAIR, BLEPHAROPTOSIS,  67904 RUL      BP 145/57  Pulse 63  Resp 16  Ht 5' 2.5" (1.588 m)  Wt 54.2 kg (119 lb 6.4 oz)  SpO2 96%  BMI 21.49 kg/m2   BMI kg/m2: 21.49 kg/m2    Primary language spoken:  English    ROS/Medical History:  General Review & History of Anesthetic Complications:    No history of anesthetic complications, No PONV, No history of difficult intubation and no family history of anesthetic complications      Cardiovascular:    (+)  hypercholesterolemia  hypertension  past MI (cant remember, but long time ago)  angina (h/o been on Ranexa x 2-3 yrs)  cardiac stents 2001          ECG reviewed   Pulmonary:  - negative ROS   (-) asthma, shortness of breath, recent URI, sleep apnea Hematology/Oncology:   - negative ROS   Neuro/Psych:        (+) TIA (last one 3 yrs ago), , psychiatric history (depression),    (-) seizures, CVA    ROS Comments: Had syncopal episode 02/2015 was hospitalized in [place]- told she was dehydrated. Infectious Disease:   Negative ROS         Endo/Other:      (+) diabetes mellitus (diet controlled), hypothyroidism, arthritisback pain GI/Hepatic:     (+) GERD well controlled,    Renal:           ROS comments Hospitalized at [facility] last month for UTI Pregnancy History:             [facility]) Notes:  no beta blocker therapy  [facility] Test & records reviewed by [facility] provider    Preoperative instructions and education given to patient:  NPO after midnight prior to surgery.  Take all BP and Heart medications on your regular schedule with a small sip of water.  You must have a driver to and from surgery. If you have a post op visit the day after surgery, you will need a driver for that appointment.  Bring all of your eye medications, a pair of  dark/protective glasses, your white surgical folder in your blue [facility] bag on the day of surgery and for your post op appointment. Daughter to assist after surgery:  :              Physical Exam    Airway:  Inter-inciser distance > 4 cm  Prognanth Able    Mallampati: I  Neck ROM: full  TM distance: > 6 cm  Short thick neck: No    Cardiovascular:  - cardiovascular exam normal   Rhythm: regular   Rate: normal         Pulmonary:  - pulmonary exam normal      breath sounds clear to auscultation        Neuro/Neck/Skeletal/Skin:  - [place] ANE PHYS EXAM NEGATIVE ROS SKIN SKELETAL NEURO NECK          Dental:      Abdominal:   - normal exam     General: normal weight   Abdomen: soft.  Additional Clinical Notes:   Other Physical Exam Findings: Instructed to hold Losartan am of surgery. Will get records from hospitalizations if possible.            Last  OSA (STOP BANG) Score:  No Data Recorded    Last OSA Score for   Has a physician diagnosed you with sleep agnea?: No  Do you snore loudly (loud enough to be heard through a closed door)?: 0  Do you often feel tired, fatigued or sleepy during the day?: 0  Has anyone observed that you stop breathing while you are sleeping?: 0  Have you ever been treated for high blood pressure?: 1  OSA total score (A score of 2 or more is high risk. Offer patient sleep study.): 1      Has a physician diagnosed you with sleep agnea?: No     OSA total score (A score of 2 or more is high risk. Offer patient sleep study.): 1    Past Medical History   Diagnosis Date    Brain tumor (CMS-HCC)     Corneal melt, right     Diabetes mellitus (CMS-HCC)     GERD (gastroesophageal reflux disease)     Hypertension     Hypertension     Hypothyroidism     Ischemic heart disease     Major depressive disorder, single episode     Senile ectropion     TIA (transient ischemic attack) 2002     x 2    Unspecified ptosis of eyelid      Past Surgical History   Procedure Laterality Date    Benign brain tumor    1990    Reconstruction face with nerve graft secondary to brain surgery      Cardiac stent type unknown 2001      Cataract extraction w/ intraocular lens  implant, bilateral Bilateral     Biopsy rt breast       Tubal ligation      Belpharoptosis repair Right      Social History   Substance Use Topics    Smoking status: Former Smoker     Packs/day: 0.50     Years: 5.00     Quit date: 05/20/2000    Smokeless tobacco: Not on file    Alcohol use No       Current Outpatient Prescriptions   Medication Sig Dispense Refill    aspirin 81 MG tablet Take 81 mg by mouth daily.      escitalopram (LEXAPRO) 5 MG tablet Take 5 mg by mouth daily.      folic acid (FOLVITE) 1 MG tablet Take 1 mg by mouth daily.      levothyroxine (SYNTHROID) 50 MCG tablet Take 50 mcg by mouth every morning (before breakfast).      losartan (COZAAR) 50 MG tablet Take 50 mg by mouth daily.      pantoprazole (PROTONIX) 40 MG tablet Take 40 mg by mouth daily.      Polyethyl Glycol-Propyl Glycol (SYSTANE) 0.4-0.3 % GEL       Polyethyl Glycol-Propyl Glycol (SYSTANE) 0.4-0.3 % SOLN       ranolazine (RANEXA) 500 MG Sustained-Release tablet Take 500 mg by mouth 2 times daily.      simvastatin (ZOCOR) 20 MG tablet Take 20 mg by mouth every evening.      traMADol (ULTRAM) 50 MG tablet Take 50 mg by mouth every 6 hours as needed for Moderate Pain (Pain Score 4-6).  No current facility-administered medications for this visit.      Allergies   Allergen Reactions    Bromday Other     Corneal melt      Codeine Unspecified       Labs and Other Data  No results found for: NA, K, CL, BICARB, BUN, CREAT, GLU, [place]  No results found for: AST, ALT, GGT, LDH, ALK, TP, ALB, TBILI, DBILI  No results found for: WBC, RBC, HGB, HCT, MCV, MCHC, RDW, PLT, MPV, SEG, LYMPHS, MONOS, EOS, BASOS  No results found for: INR, PTT  No results found for: ARTPH, ARTPO2, ARTPCO2    Anesthesia Plan

## 2015-05-21 NOTE — Anesthesia Preprocedure Evaluation (Addendum)
ANESTHESIA PRE-OPERATIVE EVALUATION    Patient Information    Name: Krystal Berger    MRN: [id number]    DOB: 08/29/1931    Age: 81 year old    Sex: female  Procedure(s):  REPAIR, BLEPHAROPTOSIS,  67904 RUL      BP 145/57  Pulse 63  Resp 16  Ht 5' 2.5" (1.588 m)  Wt 54.2 kg (119 lb 6.4 oz)  SpO2 96%  BMI 21.49 kg/m2   BMI kg/m2: 21.49 kg/m2    Primary language spoken:  English    ROS/Medical History:  General Review & History of Anesthetic Complications:    No history of anesthetic complications, No PONV, No history of difficult intubation and no family history of anesthetic complications      Cardiovascular:    (+)  hypercholesterolemia  hypertension  past MI (cant remember, but long time ago)  angina (h/o been on Ranexa x 2-3 yrs)  cardiac stents 2001          ECG reviewed  ROS comment: Pos CP c stress or heavy exertion., No SOB, Pos DOE 1 block, No PND, orth or Palps   Pulmonary:  - negative ROS   (-) asthma, shortness of breath, recent URI, sleep apnea Hematology/Oncology:   - negative ROS   Neuro/Psych:        (+) TIA (last one 3 yrs ago), , psychiatric history (depression),    (-) seizures, CVA    ROS Comments: Had syncopal episode 02/2015 was hospitalized in [place]- told she was dehydrated. Infectious Disease:   Negative ROS         Endo/Other:      (+) diabetes mellitus (diet controlled), hypothyroidism, arthritisback pain GI/Hepatic:     (+) GERD well controlled,    Renal:           ROS comments Hospitalized at [facility] last month for UTI Pregnancy History:             [facility] Notes:  no beta blocker therapy  [facility] Test & records reviewed by [facility] provider    Preoperative instructions and education given to patient:  NPO after midnight prior to surgery.  Take all BP and Heart medications on your regular schedule with a small sip of water.  You must have a driver to and from surgery. If you have a post op visit the day after surgery, you will need a driver for that appointment.  Bring all of your eye  medications, a pair of dark/protective glasses, your white surgical folder in your blue [facility] bag on the day of surgery and for your post op appointment. Daughter to assist after surgery:  :      Preoperative instructions and education given to patient:  NPO after midnight prior to surgery.  Take all BP and Heart medications on your regular schedule with a small sip of water.  You must have a driver to and from surgery. If you have a post op visit the day after surgery, you will need a driver for that appointment.  Bring all of your eye medications, a pair of dark/protective glasses, your white surgical folder in your blue [facility] bag on the day of surgery and for your post op appointment. Daughter to assist after surgery:          Physical Exam    Airway:  Inter-inciser distance > 4 cm  Prognanth Able    Mallampati: I  Neck ROM: full  TM distance: > 6 cm    Short thick neck: No    Cardiovascular:  - cardiovascular exam normal   Rhythm: regular   Rate: normal         Pulmonary:  - pulmonary exam normal      breath sounds clear to auscultation        Neuro/Neck/Skeletal/Skin:  - [place] ANE PHYS EXAM NEGATIVE ROS SKIN SKELETAL NEURO NECK          Dental:      Abdominal:   - normal exam     General: normal weight   Abdomen: soft.     Additional Clinical Notes:   Other Physical Exam Findings: Instructed to hold Losartan am of surgery. Will get records from hospitalizations if possible.        Last  OSA (STOP BANG) Score:  No Data Recorded    Last OSA Score for   Has a physician diagnosed you with sleep agnea?: No  Do you snore loudly (loud enough to be heard through a closed door)?: 0  Do you often feel tired, fatigued or sleepy during the day?: 0  Has anyone observed that you stop breathing while you are sleeping?: 0  Have you ever been treated for high blood pressure?: 1  OSA total score (A score of 2 or more is high risk. Offer patient sleep study.): 1      Has a physician diagnosed you with sleep agnea?: No     OSA total  score (A score of 2 or more is high risk. Offer patient sleep study.): 1    Past Medical History   Diagnosis Date    Brain tumor (CMS-HCC)     Corneal melt, right     Diabetes mellitus (CMS-HCC)     GERD (gastroesophageal reflux disease)     Hypertension     Hypertension     Hypothyroidism     Ischemic heart disease     Major depressive disorder, single episode     Senile ectropion     TIA (transient ischemic attack) 2002     x 2    Unspecified ptosis of eyelid      Past Surgical History   Procedure Laterality Date    Benign brain tumor   1990    Reconstruction face with nerve graft secondary to brain surgery      Cardiac stent type unknown 2001      Cataract extraction w/ intraocular lens  implant, bilateral Bilateral     Biopsy rt breast       Tubal ligation      Belpharoptosis repair Right      Social History   Substance Use Topics    Smoking status: Former Smoker     Packs/day: 0.50     Years: 5.00     Quit date: 05/20/2000    Smokeless tobacco: Not on file    Alcohol use No       Current Outpatient Prescriptions   Medication Sig Dispense Refill    aspirin 81 MG tablet Take 81 mg by mouth daily.      escitalopram (LEXAPRO) 5 MG tablet Take 5 mg by mouth daily.      folic acid (FOLVITE) 1 MG tablet Take 1 mg by mouth daily.      levothyroxine (SYNTHROID) 50 MCG tablet Take 50 mcg by mouth every morning (before breakfast).      losartan (COZAAR) 50 MG tablet Take 50 mg by mouth daily.      pantoprazole (PROTONIX) 40 MG  tablet Take 40 mg by mouth daily.      Polyethyl Glycol-Propyl Glycol (SYSTANE) 0.4-0.3 % GEL       Polyethyl Glycol-Propyl Glycol (SYSTANE) 0.4-0.3 % SOLN       ranolazine (RANEXA) 500 MG Sustained-Release tablet Take 500 mg by mouth 2 times daily.      simvastatin (ZOCOR) 20 MG tablet Take 20 mg by mouth every evening.      traMADol (ULTRAM) 50 MG tablet Take 50 mg by mouth every 6 hours as needed for Moderate Pain (Pain Score 4-6).       No current  facility-administered medications for this visit.      Allergies   Allergen Reactions    Bromday Other     Corneal melt      Codeine Unspecified       Labs and Other Data  No results found for: NA, K, CL, BICARB, BUN, CREAT, GLU, [place]  No results found for: AST, ALT, GGT, LDH, ALK, TP, ALB, TBILI, DBILI  No results found for: WBC, RBC, HGB, HCT, MCV, MCHC, RDW, PLT, MPV, SEG, LYMPHS, MONOS, EOS, BASOS  No results found for: INR, PTT  No results found for: ARTPH, ARTPO2, ARTPCO2    Anesthesia Plan:  ASA 3 (Severe systemic disease)     Planned anesthesia method: MAC (Sedation)   Planned monitoring method: Routine monitoring      Comments: (BS 81.  Will give D5LR and recheck in recovery    Took Renexa and Synthroid at 9:30am)   Dental risks explained & discussed  Anesthetic plan and risks discussed with Patient and Daughter.         Plan discussed with Surgeon.

## 2015-06-03 ENCOUNTER — Ambulatory Visit (HOSPITAL_BASED_OUTPATIENT_CLINIC_OR_DEPARTMENT_OTHER): Payer: Medicare Other | Admitting: Nurse Practitioner

## 2015-06-03 ENCOUNTER — Encounter (HOSPITAL_BASED_OUTPATIENT_CLINIC_OR_DEPARTMENT_OTHER): Payer: Self-pay | Admitting: Anesthesiology

## 2015-06-03 ENCOUNTER — Encounter (HOSPITAL_BASED_OUTPATIENT_CLINIC_OR_DEPARTMENT_OTHER): Admission: RE | Disposition: A | Discharge status: Home Health | Payer: Medicare Other | Attending: Ophthalmology

## 2015-06-03 ENCOUNTER — Ambulatory Visit
Admission: RE | Admit: 2015-06-03 | Discharge: 2015-06-03 | Disposition: A | Discharge status: Home / Self Care | Payer: Medicare Other | Attending: Ophthalmology | Admitting: Ophthalmology

## 2015-06-03 ENCOUNTER — Ambulatory Visit (HOSPITAL_BASED_OUTPATIENT_CLINIC_OR_DEPARTMENT_OTHER): Payer: Medicare Other | Admitting: Anesthesiology

## 2015-06-03 DIAGNOSIS — Z8673 Personal history of transient ischemic attack (TIA), and cerebral infarction without residual deficits: Secondary | ICD-10-CM | POA: Insufficient documentation

## 2015-06-03 DIAGNOSIS — I10 Essential (primary) hypertension: Secondary | ICD-10-CM | POA: Insufficient documentation

## 2015-06-03 DIAGNOSIS — E039 Hypothyroidism, unspecified: Secondary | ICD-10-CM | POA: Insufficient documentation

## 2015-06-03 DIAGNOSIS — H02401 Unspecified ptosis of right eyelid: Secondary | ICD-10-CM | POA: Insufficient documentation

## 2015-06-03 DIAGNOSIS — E119 Type 2 diabetes mellitus without complications: Secondary | ICD-10-CM | POA: Insufficient documentation

## 2015-06-03 DIAGNOSIS — Z87891 Personal history of nicotine dependence: Secondary | ICD-10-CM | POA: Insufficient documentation

## 2015-06-03 DIAGNOSIS — Z7982 Long term (current) use of aspirin: Secondary | ICD-10-CM | POA: Insufficient documentation

## 2015-06-03 DIAGNOSIS — K219 Gastro-esophageal reflux disease without esophagitis: Secondary | ICD-10-CM | POA: Insufficient documentation

## 2015-06-03 DIAGNOSIS — F329 Major depressive disorder, single episode, unspecified: Secondary | ICD-10-CM | POA: Insufficient documentation

## 2015-06-03 DIAGNOSIS — H538 Other visual disturbances: Secondary | ICD-10-CM

## 2015-06-03 DIAGNOSIS — I259 Chronic ischemic heart disease, unspecified: Secondary | ICD-10-CM | POA: Insufficient documentation

## 2015-06-03 LAB — GLUCOSE (POCT)
Glucose (POCT): 81 mg/dL (ref 70–99)
Glucose (POCT): 116 mg/dL — ABNORMAL HIGH (ref 70–99)

## 2015-06-03 SURGERY — REPAIR, BLEPHAROPTOSIS, BILATERAL
Anesthesia: Monitored Anesthesia Care (MAC) | Site: Face | Laterality: Right

## 2015-06-03 MED ORDER — NEOMYCIN-POLYMYXIN-DEXAMETH 3.5-10000-0.1 OP OINT
1.0000 | TOPICAL_OINTMENT | Freq: Two times a day (BID) | OPHTHALMIC | 1 refills | Status: AC
Start: 2015-06-03 — End: 2015-06-17

## 2015-06-03 MED ORDER — FENTANYL CITRATE (PF) 250 MCG/5ML IJ SOLN
INTRAMUSCULAR | Status: DC | PRN
Start: 2015-06-03 — End: 2015-06-03
  Administered 2015-06-03: 50 ug via INTRAVENOUS

## 2015-06-03 MED ORDER — LIDOCAINE-EPINEPHRINE 2 %-1:100000 IJ SOLN
INTRAMUSCULAR | Status: DC | PRN
Start: 2015-06-03 — End: 2015-06-03
  Administered 2015-06-03: 1 mL

## 2015-06-03 MED ORDER — LACTATED RINGERS IV SOLN
INTRAVENOUS | Status: DC
Start: 2015-06-03 — End: 2015-06-03

## 2015-06-03 MED ORDER — ONDANSETRON HCL 4 MG/2ML IV SOLN
4.0000 mg | Freq: Once | INTRAMUSCULAR | Status: DC | PRN
Start: 2015-06-03 — End: 2015-06-03
  Filled 2015-06-03: qty 2

## 2015-06-03 MED ORDER — NEOMYCIN-POLYMYXIN-DEXAMETH 3.5-10000-0.1 OP OINT
TOPICAL_OINTMENT | OPHTHALMIC | Status: DC | PRN
Start: 2015-06-03 — End: 2015-06-03
  Administered 2015-06-03: 1 via OPHTHALMIC

## 2015-06-03 MED ORDER — BSS IO SOLN
INTRAOCULAR | Status: DC | PRN
Start: 2015-06-03 — End: 2015-06-03
  Administered 2015-06-03: 15 mL via TOPICAL

## 2015-06-03 MED ORDER — ACETAMINOPHEN 325 MG PO TABS
325.0000 mg | ORAL_TABLET | Freq: Once | ORAL | Status: DC | PRN
Start: 2015-06-03 — End: 2015-06-03
  Administered 2015-06-03: 325 mg via ORAL
  Filled 2015-06-03: qty 1

## 2015-06-03 MED ORDER — TETRACAINE HCL 0.5 % OP SOLN
OPHTHALMIC | Status: DC | PRN
Start: 2015-06-03 — End: 2015-06-03
  Administered 2015-06-03: 2 [drp] via OPHTHALMIC

## 2015-06-03 MED ORDER — LACTATED RINGERS IV SOLN
INTRAVENOUS | Status: DC | PRN
Start: 2015-06-03 — End: 2015-06-03
  Administered 2015-06-03: 12:00:00 via INTRAVENOUS

## 2015-06-03 MED ORDER — MIDAZOLAM HCL 2 MG/2ML IJ SOLN
INTRAMUSCULAR | Status: DC | PRN
Start: 2015-06-03 — End: 2015-06-03
  Administered 2015-06-03: 1 mg via INTRAVENOUS

## 2015-06-03 MED ORDER — FENTANYL CITRATE (PF) 100 MCG/2ML IJ SOLN
12.5000 ug | INTRAMUSCULAR | Status: DC | PRN
Start: 2015-06-03 — End: 2015-06-03
  Filled 2015-06-03: qty 2

## 2015-06-03 MED ORDER — POVIDONE-IODINE 5 % OP SOLN
OPHTHALMIC | Status: DC | PRN
Start: 2015-06-03 — End: 2015-06-03
  Administered 2015-06-03: 1 [drp] via OPHTHALMIC

## 2015-06-03 MED ORDER — NALOXONE HCL 0.4 MG/ML IJ SOLN
20.0000 ug | INTRAMUSCULAR | Status: DC | PRN
Start: 2015-06-03 — End: 2015-06-03
  Filled 2015-06-03: qty 0.05

## 2015-06-03 MED ORDER — ONDANSETRON HCL 4 MG/2ML IV SOLN
INTRAMUSCULAR | Status: DC | PRN
Start: 2015-06-03 — End: 2015-06-03
  Administered 2015-06-03: 4 mg via INTRAVENOUS

## 2015-06-03 SURGICAL SUPPLY — 20 items
CANNULA NASAL FLARED W/7' TUBE (Misc Medical Supply) ×2 IMPLANT
GLOVE BIOGEL SUPER-SENSITIVE SIZE 6 (Gloves/gowns) ×2
GLOVE BIOGEL SUPER-SENSITIVE SIZE 6.5 (Gloves/gowns) ×2 IMPLANT
GLOVE BIOGEL SUPER-SENSITIVE SIZE 7 (Gloves/gowns) ×2
GLOVE BIOGEL SUPER-SENSITIVE SIZE 7.5 (Gloves/gowns) ×2 IMPLANT
GLOVE SURGEON BIOGEL SIZE 7.5 (Gloves/gowns) ×2
GOWN SURGICAL ULTRA XL BLUE, AAMI LVL 3 (Gloves/gowns) ×2 IMPLANT
NEEDLE BD HYPO 27G X 1.25" (Needles/punch/cannula/biopsy) ×2
NEEDLE PROTECT 18G X 1.5" (Needles/punch/cannula/biopsy) ×2
PROTECTOR CORNEAL CROUCH (Misc Medical Supply) ×2
SKIN MARKER SURGICAL, EXTRA FINE (Misc Medical Supply) ×2 IMPLANT
SOLUTION IRR POUR BTL .9% N/S 250 (Non-Pharmacy Meds/Solutions) ×2
SOLUTION IRR POUR BTL H20 250ML (Non-Pharmacy Meds/Solutions) ×2
STOPCOCK 33 EXT LEURLOCK (Kits/Sets/Trays) ×2 IMPLANT
SURGICAL PACK OCULARPLASTY - MEDLINE (Drape/Gowns/Gloves/Pack) ×2 IMPLANT
SUTURE PERMA-HAND SILK 6-0 18" 765 (Suture)
SUTURE PLAIN GUT 6-0 18" PC-1 1916, FAST ABSORB (Suture) ×2 IMPLANT
SUTURE PROLENE P-1 6-0 (Suture) ×2
SUTURE VICRYL 7-0 12" TG140-8 (Suture) ×2
SYRINGE HYPO LL 10CC (Needles/punch/cannula/biopsy) ×2

## 2015-06-03 NOTE — Interval H&P Note (Signed)
Attending Attestation of H&P:    I agree with the content of the H&P as stated: yes  I have corrections or additions as indicated below: no    History & Physical - Interval Assessment:    Krystal Berger  [id number]    Current medical status:  Unchanged    Medications/allergies:   Unchanged    Review of symptoms:  Unchanged    Physical examination:  I have examined the patient today.  Unchanged    Laboratory or clinical data:  Unchanged    Modifications of initial care plan:  Unchanged      Krystal Berger 06/03/15  11:29 AM

## 2015-06-03 NOTE — Brief Op Note (Signed)
OPHTHALMOLOGY BRIEF OPERATIVE NOTE    DATE: 06/03/15    PREOPERATIVE DIAGNOSIS:   1. Right upper eyelid ptosis.     POSTOPERATIVE DIAGNOSIS:   1. Right upper eyelid ptosis.     PROCEDURE:   1. Right upper eyelid ptosis repair by external levator advancement     SURGEON/STAFF: Aretha Parrot, MD  ASSISTANTS: Lorra Hals, MD and Randye Lobo, MD    ANESTHESIA: Monitored anesthesia care with local anesthesia  SPECIMENS: None  COMPLICATIONS: None    DISPOSITION:   Home

## 2015-06-03 NOTE — Op Note (Signed)
PREOPERATIVE DIAGNOSIS:   1. Right upper eyelid ptosis.     POSTOPERATIVE DIAGNOSIS:   1. Right upper eyelid ptosis.     PROCEDURE:   1. Right upper eyelid ptosis repair by external levator advancement     SURGEON/STAFF: Aretha Parrot, MD  ASSISTANTS: Lorra Hals, MD and Randye Lobo, MD    ANESTHESIA: Monitored anesthesia care with local anesthesia  SPECIMENS: None  COMPLICATIONS: None    NOTE: Please note Dr. Gilles Chiquito, the attending surgeon, was present and scrubbed for the entire procedure.     INDICATIONS: Krystal Berger is a 81 year old female who presented to the eye clinic with history of previous right upper eyelid ptosis repair that presents with recurrent decreased superior visual fields. The patient must manually lift the eyelids to see, and this affects activities of daily living such as reading and driving. On clinical examination, the patient was noted to have right upper eyelid ptosis with MRD1 of 0.77mm. On manual elevation of the upper eyelid, there was a greater than 30 degree improvement in superior visual field.     The patient presented today for definitive treatment of this problem consisting of right upper eyelid ptosis repair by external levator advancement. The risks, benefits, and alternatives to procedure were discussed with the patient in detail, including the risk of bleeding, infection, death, vision loss, blindness, asymmetry, cosmetic deformity, dry eye syndrome, lagophthalmos, eyelid retraction, eyelid malposition, double vision, need for further surgery, and no improvement. All questions were answered and the patient elected to proceed with surgery.     PROCEDURE IN DETAIL:   The patient was brought to the [facility] operating room. Under intravenous sedation and monitoring, local anesthesia was injected into the right upper eyelid. This consisted of 2% lidocaine with epinephrine mixed with 0.75% Marcaine. The patient was prepped and draped in a sterile fashion.          Attention was first turned to the right upper eyelid where a skin incision was made with a #15 blade in the lid crease. The orbicularis layer was then opened, as well as the orbital septum. The levator was identified. It was then advanced and secured to the upper tarsal border using a 6-0 Prolene suture. The patient was then asked to open her eyes. Excellent level and contour were obtained. The Prolene suture was then tied permanently. The margin-to-reflex distance measured +5 in the operating room. The orbicularis was then closed with two interrupted 7-0 Vicryl sutures. The skin was closed using a running 6-0 fast-absorbing gut suture.  This completed the right upper eyelid ptosis repair by external levator advancement     At the conclusion of the case the preplaced corneal protectors were removed. The patient tolerated the procedure well and was taken to the recovery room in stable condition. The patient was instructed to apply ice the the wounds for the first 48 hours and to apply ophthalmic antibiotic ointment to the eyelid incisions for 1-2 weeks. The patient was instructed to return to the eye clinic immediately for any significant pain or decreased vision. The patient will follow up in the oculoplastics clinic in one week.

## 2015-06-03 NOTE — Anesthesia Postprocedure Evaluation (Signed)
Anesthesia Transfer of Care Note    Patient: Krystal Berger    Procedures performed: Procedure(s):  RIGHT UPPER EYELID REPAIR, BLEPHAROPTOSIS REPAIR    Vital signs: stable           Anesthesia Post Note    Patient: Krystal Berger    Procedure(s) Performed: Procedure(s):  RIGHT UPPER EYELID REPAIR, BLEPHAROPTOSIS REPAIR      Final anesthesia type: MAC (Sedation)    Patient location: PACU    Post anesthesia pain: adequate analgesia    Post assessment: no apparent anesthetic complications, tolerated procedure well and no evidence of recall    Mental status: awake, alert  and oriented    Airway Patent: Yes    Last Vitals:   Vitals:    06/03/15 1113   BP: 138/57   Pulse: 65   Resp: 13   Temp: 36.1 C   SpO2: 96%       Post vital signs: stable    Hydration: adequate    N/V:no    Anesthetic complications: no    Disposal of controlled substances: All controlled substances during the case accounted for and disposed of per hospital policy P 60, Sat [id number], rr 14, BP 139/54, t 99.3, 2/10 pain whic will Rx.    Plan of care per primary team.

## 2015-06-03 NOTE — H&P (View-Only) (Signed)
Last edited 05/21/15 1047 by Garth Bigness., NP             ANESTHESIA PRE-OPERATIVE EVALUATION    Patient Information    Name: Krystal Berger    MRN: [id number]    DOB: March 02, 1932    Age: 81 year old    Sex: female  Procedure(s):  REPAIR, BLEPHAROPTOSIS,  [id number] RUL      BP 145/57  Pulse 63  Resp 16  Ht 5' 2.5" (1.588 m)  Wt 54.2 kg (119 lb 6.4 oz)  SpO2 96%  BMI 21.49 kg/m2   BMI kg/m2: 21.49 kg/m2    Primary language spoken:  English    ROS/Medical History:  General Review & History of Anesthetic Complications:    No history of anesthetic complications, No PONV, No history of difficult intubation and no family history of anesthetic complications      Cardiovascular:    (+)  hypercholesterolemia  hypertension  past MI (cant remember, but long time ago)  angina (h/o been on Ranexa x 2-3 yrs)  cardiac stents 2001          ECG reviewed   Pulmonary:  - negative ROS   (-) asthma, shortness of breath, recent URI, sleep apnea Hematology/Oncology:   - negative ROS   Neuro/Psych:        (+) TIA (last one 3 yrs ago), , psychiatric history (depression),    (-) seizures, CVA    ROS Comments: Had syncopal episode 02/2015 was hospitalized in [place]- told she was dehydrated. Infectious Disease:   Negative ROS         Endo/Other:      (+) diabetes mellitus (diet controlled), hypothyroidism, arthritisback pain GI/Hepatic:     (+) GERD well controlled,    Renal:           ROS comments Hospitalized at [facility] last month for UTI Pregnancy History:             [facility]) Notes:  no beta blocker therapy  [facility] Test & records reviewed by [facility] provider    Preoperative instructions and education given to patient:  NPO after midnight prior to surgery.  Take all BP and Heart medications on your regular schedule with a small sip of water.  You must have a driver to and from surgery. If you have a post op visit the day after surgery, you will need a driver for that appointment.  Bring all of your eye medications, a pair of  dark/protective glasses, your white surgical folder in your blue [facility] bag on the day of surgery and for your post op appointment. Daughter to assist after surgery:  :              Physical Exam    Airway:  Inter-inciser distance > 4 cm  Prognanth Able    Mallampati: I  Neck ROM: full  TM distance: > 6 cm  Short thick neck: No    Cardiovascular:  - cardiovascular exam normal   Rhythm: regular   Rate: normal         Pulmonary:  - pulmonary exam normal      breath sounds clear to auscultation        Neuro/Neck/Skeletal/Skin:  - [place] ANE PHYS EXAM NEGATIVE ROS SKIN SKELETAL NEURO NECK          Dental:      Abdominal:   - normal exam     General: normal weight   Abdomen: soft.  Additional Clinical Notes:   Other Physical Exam Findings: Instructed to hold Losartan am of surgery. Will get records from hospitalizations if possible.            Last  OSA (STOP BANG) Score:  No Data Recorded    Last OSA Score for   Has a physician diagnosed you with sleep agnea?: No  Do you snore loudly (loud enough to be heard through a closed door)?: 0  Do you often feel tired, fatigued or sleepy during the day?: 0  Has anyone observed that you stop breathing while you are sleeping?: 0  Have you ever been treated for high blood pressure?: 1  OSA total score (A score of 2 or more is high risk. Offer patient sleep study.): 1      Has a physician diagnosed you with sleep agnea?: No     OSA total score (A score of 2 or more is high risk. Offer patient sleep study.): 1    Past Medical History   Diagnosis Date    Brain tumor (CMS-HCC)     Corneal melt, right     Diabetes mellitus (CMS-HCC)     GERD (gastroesophageal reflux disease)     Hypertension     Hypertension     Hypothyroidism     Ischemic heart disease     Major depressive disorder, single episode     Senile ectropion     TIA (transient ischemic attack) 2002     x 2    Unspecified ptosis of eyelid      Past Surgical History   Procedure Laterality Date    Benign brain tumor    1990    Reconstruction face with nerve graft secondary to brain surgery      Cardiac stent type unknown 2001      Cataract extraction w/ intraocular lens  implant, bilateral Bilateral     Biopsy rt breast       Tubal ligation      Belpharoptosis repair Right      Social History   Substance Use Topics    Smoking status: Former Smoker     Packs/day: 0.50     Years: 5.00     Quit date: 05/20/2000    Smokeless tobacco: Not on file    Alcohol use No       Current Outpatient Prescriptions   Medication Sig Dispense Refill    aspirin 81 MG tablet Take 81 mg by mouth daily.      escitalopram (LEXAPRO) 5 MG tablet Take 5 mg by mouth daily.      folic acid (FOLVITE) 1 MG tablet Take 1 mg by mouth daily.      levothyroxine (SYNTHROID) 50 MCG tablet Take 50 mcg by mouth every morning (before breakfast).      losartan (COZAAR) 50 MG tablet Take 50 mg by mouth daily.      pantoprazole (PROTONIX) 40 MG tablet Take 40 mg by mouth daily.      Polyethyl Glycol-Propyl Glycol (SYSTANE) 0.4-0.3 % GEL       Polyethyl Glycol-Propyl Glycol (SYSTANE) 0.4-0.3 % SOLN       ranolazine (RANEXA) 500 MG Sustained-Release tablet Take 500 mg by mouth 2 times daily.      simvastatin (ZOCOR) 20 MG tablet Take 20 mg by mouth every evening.      traMADol (ULTRAM) 50 MG tablet Take 50 mg by mouth every 6 hours as needed for Moderate Pain (Pain Score 4-6).  No current facility-administered medications for this visit.      Allergies   Allergen Reactions    Bromday Other     Corneal melt      Codeine Unspecified       Labs and Other Data  No results found for: NA, K, CL, BICARB, BUN, CREAT, GLU, [place]  No results found for: AST, ALT, GGT, LDH, ALK, TP, ALB, TBILI, DBILI  No results found for: WBC, RBC, HGB, HCT, MCV, MCHC, RDW, PLT, MPV, SEG, LYMPHS, MONOS, EOS, BASOS  No results found for: INR, PTT  No results found for: ARTPH, ARTPO2, ARTPCO2    Anesthesia Plan

## 2015-06-05 NOTE — Discharge Instructions (Signed)
AFTER YOUR OCULOFACIAL PLASTIC AND RECONSTRUCTIVE SURGERY  Thank you for allowing us to be part of your healthcare team.  We look forward to continuing your care the post-operative period.      Your follow-up appointment after today's surgery is: Tuesday, June 11, 2015 at 7:30AM in the oculoplastics clinic on the first floor of the [facility].    The following are recommendations to make your recovery more comfortable and safe:    Diet:  Resume your normal diet today.    Medications:     -  Please apply the ointment to your incisions every 12 hours.      -  You may take Tylenol (acetaminophen) as directed on the bottle as needed for any pain or discomfort.  Resume all other medications that you were previously taking unless otherwise instructed.  Do not take any pain relievers containing aspirin for the first week after surgery.    Ice Packs: You have been provided with an ice pack.  Use it on the way home and for the first 72 hours after surgery.  Use it for twenty (20) minutes on and twenty minutes off while you are awake.  Use crushed ice if possible, or at least use very small cubes.  You must have a very moist gauze pad on the area prior to putting on the ice packs.    Eye Patch: You do not need an eye patch following your procedure.  Use your prescribed eye drops and/or ointment as directed.    Activity:  For the first two weeks after your surgery, avoid lifting more than 15 pounds, bending at the waist, straining, and dirty environments (beach, pool, gardening, etc.). You may shower, but avoid touching/rubbing the face or letting excess water or soap run down the face (wash your hair "salon style" by tilting your head back and letting the water run down the back of your head).    Swelling and Bruising: Expect some swelling and black and blue discoloration.  This swelling and discoloration may, because of gravity, tend to collect in the lower eyelids or cheeks.  You may also experience tearing,  sensitivity to light, itching, bloody tears, or tightness of the eyelids.  These sensations should gradually improve day to day.    Stitches: All stitches should be kept clean.  Your surgeon will discuss timing of removal if necessary.    Call your doctor if you experience any of the symptoms listed below, or have any questions after your surgery.  We are available to talk to you by telephone or see you in person 24 hours a day, 7 days a week:    -  You experience severe pain or swelling with inability to open eyes.    -  You have a sudden increase in redness and pus from the eye.    -  You have increased blurring or dimming of your vision.    -  You have bleeding that is excessive or does not stop.    -  You have flashing lights or a dark curtain coming down over your vision.    -  You have a deep aching pain in your eye that is not relieved by Tylenol.    -  You have a temperature greater than 101 degrees.    Important phone numbers:    -  During normal business hours between the hours of 8:00AM and 4:30PM, Monday through Friday:   [facility]     [phone]  [phone]   [facility]     [phone]      -  After business hours and during weekends: please ask to speak to the ophthalmologist (eye surgeon) on call.   [facility] Message Center [phone]

## 2015-06-11 ENCOUNTER — Ambulatory Visit (INDEPENDENT_AMBULATORY_CARE_PROVIDER_SITE_OTHER): Payer: Medicare Other | Admitting: Ophthalmology

## 2015-06-14 ENCOUNTER — Ambulatory Visit (INDEPENDENT_AMBULATORY_CARE_PROVIDER_SITE_OTHER): Payer: Medicare Other | Admitting: Ophthalmology

## 2015-08-15 ENCOUNTER — Ambulatory Visit (INDEPENDENT_AMBULATORY_CARE_PROVIDER_SITE_OTHER): Payer: Medicare Other | Admitting: Ophthalmology

## 2015-09-23 ENCOUNTER — Ambulatory Visit (INDEPENDENT_AMBULATORY_CARE_PROVIDER_SITE_OTHER): Payer: Medicare Other | Admitting: Ophthalmology

## 2015-09-23 DIAGNOSIS — H1789 Other corneal scars and opacities: Secondary | ICD-10-CM

## 2015-09-23 DIAGNOSIS — H168 Other keratitis: Secondary | ICD-10-CM

## 2015-09-23 DIAGNOSIS — E093213 Drug or chemical induced diabetes mellitus with mild nonproliferative diabetic retinopathy with macular edema, bilateral: Secondary | ICD-10-CM

## 2015-10-16 ENCOUNTER — Ambulatory Visit (INDEPENDENT_AMBULATORY_CARE_PROVIDER_SITE_OTHER): Payer: Medicare Other | Admitting: Ophthalmology

## 2015-10-18 ENCOUNTER — Ambulatory Visit (INDEPENDENT_AMBULATORY_CARE_PROVIDER_SITE_OTHER): Payer: Medicare Other

## 2015-10-18 DIAGNOSIS — H1789 Other corneal scars and opacities: Secondary | ICD-10-CM

## 2015-10-30 ENCOUNTER — Ambulatory Visit (INDEPENDENT_AMBULATORY_CARE_PROVIDER_SITE_OTHER): Payer: Medicare Other | Admitting: Ophthalmology

## 2015-10-30 DIAGNOSIS — H0289 Other specified disorders of eyelid: Secondary | ICD-10-CM

## 2015-10-30 DIAGNOSIS — H04123 Dry eye syndrome of bilateral lacrimal glands: Secondary | ICD-10-CM

## 2015-11-06 ENCOUNTER — Ambulatory Visit (INDEPENDENT_AMBULATORY_CARE_PROVIDER_SITE_OTHER): Payer: Medicare Other | Admitting: Ophthalmology

## 2015-11-26 ENCOUNTER — Encounter (INDEPENDENT_AMBULATORY_CARE_PROVIDER_SITE_OTHER): Payer: Medicare Other | Admitting: Optometrist

## 2015-11-26 DIAGNOSIS — H43811 Vitreous degeneration, right eye: Secondary | ICD-10-CM

## 2015-11-26 DIAGNOSIS — H04123 Dry eye syndrome of bilateral lacrimal glands: Secondary | ICD-10-CM

## 2015-12-06 ENCOUNTER — Ambulatory Visit (INDEPENDENT_AMBULATORY_CARE_PROVIDER_SITE_OTHER): Payer: Medicare Other

## 2015-12-06 DIAGNOSIS — H1849 Other corneal degeneration: Secondary | ICD-10-CM

## 2016-05-20 ENCOUNTER — Ambulatory Visit (INDEPENDENT_AMBULATORY_CARE_PROVIDER_SITE_OTHER): Payer: Medicare Other | Admitting: Ophthalmology

## 2019-06-13 DIAGNOSIS — I2510 Atherosclerotic heart disease of native coronary artery without angina pectoris: Secondary | ICD-10-CM | POA: Insufficient documentation

## 2019-06-13 DIAGNOSIS — Z8679 Personal history of other diseases of the circulatory system: Secondary | ICD-10-CM | POA: Insufficient documentation

## 2019-06-13 DIAGNOSIS — R413 Other amnesia: Secondary | ICD-10-CM | POA: Insufficient documentation

## 2019-06-13 DIAGNOSIS — E039 Hypothyroidism, unspecified: Secondary | ICD-10-CM | POA: Insufficient documentation

## 2019-06-13 DIAGNOSIS — I10 Essential (primary) hypertension: Secondary | ICD-10-CM | POA: Insufficient documentation

## 2019-06-13 DIAGNOSIS — M810 Age-related osteoporosis without current pathological fracture: Secondary | ICD-10-CM | POA: Insufficient documentation

## 2019-07-27 DIAGNOSIS — D692 Other nonthrombocytopenic purpura: Secondary | ICD-10-CM | POA: Insufficient documentation

## 2019-07-27 DIAGNOSIS — Z9181 History of falling: Secondary | ICD-10-CM | POA: Insufficient documentation

## 2019-07-27 DIAGNOSIS — F0390 Unspecified dementia without behavioral disturbance: Secondary | ICD-10-CM | POA: Insufficient documentation

## 2020-04-17 ENCOUNTER — Encounter (INDEPENDENT_AMBULATORY_CARE_PROVIDER_SITE_OTHER): Payer: Self-pay

## 2020-04-17 ENCOUNTER — Encounter (INDEPENDENT_AMBULATORY_CARE_PROVIDER_SITE_OTHER): Payer: Medicare Other | Admitting: Geriatric Medicine

## 2020-06-30 ENCOUNTER — Emergency Department (HOSPITAL_COMMUNITY): Payer: Medicare Other

## 2020-06-30 ENCOUNTER — Encounter (HOSPITAL_COMMUNITY): Payer: Self-pay | Admitting: Emergency Medicine

## 2020-06-30 ENCOUNTER — Other Ambulatory Visit: Payer: Self-pay

## 2020-06-30 DIAGNOSIS — F419 Anxiety disorder, unspecified: Secondary | ICD-10-CM | POA: Insufficient documentation

## 2020-06-30 DIAGNOSIS — R0602 Shortness of breath: Secondary | ICD-10-CM | POA: Diagnosis present

## 2020-06-30 DIAGNOSIS — F329 Major depressive disorder, single episode, unspecified: Secondary | ICD-10-CM | POA: Insufficient documentation

## 2020-06-30 DIAGNOSIS — R110 Nausea: Secondary | ICD-10-CM | POA: Insufficient documentation

## 2020-06-30 LAB — CBC
HCT: 41.8 % (ref 36.0–46.0)
Hemoglobin: 13.8 g/dL (ref 12.0–15.0)
MCH: 32.8 pg (ref 26.0–34.0)
MCHC: 33 g/dL (ref 30.0–36.0)
MCV: 99.3 fL (ref 80.0–100.0)
Platelets: 111 10*3/uL — ABNORMAL LOW (ref 150–400)
RBC: 4.21 MIL/uL (ref 3.87–5.11)
RDW: 13.4 % (ref 11.5–15.5)
WBC: 4.8 10*3/uL (ref 4.0–10.5)
nRBC: 0 % (ref 0.0–0.2)

## 2020-06-30 LAB — BASIC METABOLIC PANEL
Anion gap: 9 (ref 5–15)
BUN: 18 mg/dL (ref 8–23)
CO2: 25 mmol/L (ref 22–32)
Calcium: 9.1 mg/dL (ref 8.9–10.3)
Chloride: 109 mmol/L (ref 98–111)
Creatinine, Ser: 0.74 mg/dL (ref 0.44–1.00)
GFR, Estimated: >60 mL/min (ref >60)
Glucose, Bld: 125 mg/dL — ABNORMAL HIGH (ref 70–99)
Potassium: 4 mmol/L (ref 3.5–5.1)
Sodium: 143 mmol/L (ref 135–145)

## 2020-06-30 LAB — TROPONIN I (HIGH SENSITIVITY): Troponin I (High Sensitivity): 6 ng/L (ref <18)

## 2020-06-30 NOTE — ED Notes (Signed)
Patient called for room placement x1 with no answer. 

## 2020-06-30 NOTE — ED Triage Notes (Signed)
Patient states she woke up and felt like she could not breathe, states she had nausea, hx of anxiety. Patient is not very contributory on medical hx. Denies CP, or pain anywhere.

## 2020-07-01 ENCOUNTER — Emergency Department (HOSPITAL_COMMUNITY)
Admission: EM | Admit: 2020-07-01 | Discharge: 2020-07-01 | Disposition: A | Discharge status: Home / Self Care | Payer: Medicare Other | Attending: Emergency Medicine | Admitting: Emergency Medicine

## 2020-07-01 DIAGNOSIS — F419 Anxiety disorder, unspecified: Secondary | ICD-10-CM

## 2020-07-01 DIAGNOSIS — F4321 Adjustment disorder with depressed mood: Secondary | ICD-10-CM

## 2020-07-01 HISTORY — DX: Anxiety disorder, unspecified: F41.9

## 2020-07-01 MED ORDER — LORAZEPAM 0.5 MG PO TABS: 1.0000 mg | ORAL_TABLET | Freq: Every day | ORAL | 0 refills | Status: DC

## 2020-07-01 NOTE — Discharge Instructions (Signed)
Use Ativan at night to aid sleep and lessen anxiety. If these symptoms change or worsen, please return to the emergency department at any time.   A resource guide has been provided. There are many different types of counselors on this list - [facility] and [facility] are recommended for grief counseling and depression of this type.

## 2020-07-01 NOTE — ED Notes (Signed)
Pt stated that she had went home, laid down to go to sleep, and decided to come back.

## 2020-07-01 NOTE — ED Notes (Signed)
Pt came to registration to check on her status. She had been called 3 times and moved off the floor. She was moved back on the floor to continue waiting for a room placement.

## 2020-07-01 NOTE — ED Provider Notes (Signed)
[facility]-EMERGENCY DEPT Provider Note   CSN: [id number] Arrival date & time: 06/30/20  0743     History Chief Complaint  Patient presents with  . Shortness of Breath  . Nausea    Jaime Cox is a 87 y.o. female.  Patient to ED for evaluation of symptoms of nausea and 'hard to breathe'. She and her son report she is well during the day and for the past 2-3 nights has woken up startled with presenting symptoms, crying. The symptoms resolve over time. She reports the loss of her husband and a daughter, both in the last 2 months. She states she wakes up thinking about them. No SI/HI. She is not receiving any grief counseling. No fever, cough, congestion, vomiting, diarrhea, chest pain, abdominal pain or urinary symptoms.   The history is provided by the patient and a relative. No language interpreter was used.  Shortness of Breath Associated symptoms: no fever and no vomiting        Past Medical History:  Diagnosis Date  . Anxiety     There are no problems to display for this patient.   OB History   No obstetric history on file.     No family history on file.     Home Medications Prior to Admission medications   Medication Sig Start Date End Date Taking? Authorizing Provider  buPROPion (WELLBUTRIN) 75 MG tablet Take 75 mg by mouth 2 (two) times daily. 05/13/20   [provider]  carvedilol (COREG) 3.125 MG tablet Take 3.125 mg by mouth 2 (two) times daily. 05/13/20   [provider]    Allergies    Codeine  Review of Systems   Review of Systems  Constitutional: Negative for chills and fever.  HENT: Negative.   Respiratory: Positive for shortness of breath.   Cardiovascular: Negative.   Gastrointestinal: Positive for nausea. Negative for vomiting.  Musculoskeletal: Negative.   Skin: Negative.   Neurological: Negative.   Psychiatric/Behavioral: Positive for dysphoric mood. Negative for confusion. The patient is  nervous/anxious.     Physical Exam Updated Vital Signs BP (!) 152/101 (BP Location: Left Arm)   Pulse 71   Temp 97.7 F (36.5 C) (Oral)   Resp 12   Ht 5' 3.5" (1.613 m)   Wt 59.9 kg   SpO2 99%   BMI 23.02 kg/m   Physical Exam Vitals and nursing note reviewed.  Constitutional:      Appearance: She is well-developed and well-nourished.  HENT:     Head: Normocephalic.  Cardiovascular:     Rate and Rhythm: Normal rate and regular rhythm.  Pulmonary:     Effort: Pulmonary effort is normal.     Breath sounds: Normal breath sounds.  Abdominal:     General: Bowel sounds are normal.     Palpations: Abdomen is soft.     Tenderness: There is no abdominal tenderness. There is no guarding or rebound.  Musculoskeletal:        General: Normal range of motion.     Cervical back: Normal range of motion and neck supple.  Skin:    General: Skin is warm and dry.     Findings: No rash.  Neurological:     Mental Status: She is alert and oriented to person, place, and time.  Psychiatric:        Attention and Perception: She does not perceive auditory or visual hallucinations.        Mood and Affect: Mood and affect  normal. Affect is tearful.        Speech: Speech normal.        Behavior: Behavior normal. Behavior is cooperative.        Thought Content: Thought content is not delusional. Thought content does not include homicidal or suicidal ideation.        Cognition and Memory: Cognition and memory normal.        Judgment: Judgment normal.     ED Results / Procedures / Treatments   Labs (all labs ordered are listed, but only abnormal results are displayed) Labs Reviewed  BASIC METABOLIC PANEL - Abnormal; Notable for the following components:      Result Value   Glucose, Bld 125 (*)    All other components within normal limits  CBC - Abnormal; Notable for the following components:   Platelets 111 (*)    All other components within normal limits  TROPONIN I (HIGH SENSITIVITY)   TROPONIN I (HIGH SENSITIVITY)    EKG EKG Interpretation  Date/Time:  Sunday June 30 2020 08:01:49 EST Ventricular Rate:  60 PR Interval:    QRS Duration: 96 QT Interval:  480 QTC Calculation: 480 R Axis:   -24 Text Interpretation: Sinus rhythm Borderline left axis deviation Anterior infarct, old Abnormal T, consider ischemia, lateral leads No old tracing to compare Confirmed by Aletta Edouard [phone]) on 06/30/2020 8:12:07 AM   Radiology DG Chest 2 View  Result Date: 06/30/2020 CLINICAL DATA:  87 year old female with shortness of breath and nausea this morning. EXAM: CHEST - 2 VIEW COMPARISON:  None. FINDINGS: Lung volumes are within normal limits. Calcified aortic atherosclerosis. Other mediastinal contours are within normal limits. Visualized tracheal air column is within normal limits. Both lungs appear clear. No pneumothorax or pleural effusion. Abdominal IVC filter partially visible. Negative visible bowel gas. Previous right humerus ORIF. No acute osseous abnormality identified. IMPRESSION: 1. No acute cardiopulmonary abnormality. 2. IVC filter. 3.  Aortic Atherosclerosis (ICD10-I70.0). Electronically Signed   By: Genevie Ann M.D.   On: 06/30/2020 08:32    Procedures Procedures (including critical care time)  Medications Ordered in ED Medications - No data to display  ED Course  I have reviewed the triage vital signs and the nursing notes.  Pertinent labs & imaging results that were available during my care of the patient were reviewed by me and considered in my medical decision making (see chart for details).    MDM Rules/Calculators/A&P                          Patient to ED with ss/sxs as detailed in the HPI.   Labs reviewed and are essentially normal. CXR clear. EKG without ischemic change.   The patient is waking at night with symptoms of panic/anxiety most likely related to the recent deaths of her husband and a daughter. Recommended counseling and referrals  provided. Will Rx Ativan 0.5 mg for use at night time.   Return precautions discussed.   Final Clinical Impression(s) / ED Diagnoses Final diagnoses:  None   1. Depression/Grief reaction 2. Anxiety   Rx / DC Orders ED Discharge Orders    None       Charlann Lange, PA-C 07/01/20 0344    Molpus, Jenny Reichmann, MD 07/01/20 [phone]

## 2020-07-09 ENCOUNTER — Emergency Department (HOSPITAL_COMMUNITY): Payer: Medicare Other

## 2020-07-09 ENCOUNTER — Emergency Department (HOSPITAL_COMMUNITY)
Admission: EM | Admit: 2020-07-09 | Discharge: 2020-07-09 | Disposition: A | Discharge status: Home / Self Care | Payer: Medicare Other | Attending: Emergency Medicine | Admitting: Emergency Medicine

## 2020-07-09 ENCOUNTER — Encounter (HOSPITAL_COMMUNITY): Payer: Self-pay

## 2020-07-09 DIAGNOSIS — U071 COVID-19: Secondary | ICD-10-CM | POA: Diagnosis not present

## 2020-07-09 DIAGNOSIS — Z7982 Long term (current) use of aspirin: Secondary | ICD-10-CM | POA: Diagnosis not present

## 2020-07-09 DIAGNOSIS — F419 Anxiety disorder, unspecified: Secondary | ICD-10-CM | POA: Diagnosis not present

## 2020-07-09 DIAGNOSIS — R0602 Shortness of breath: Secondary | ICD-10-CM | POA: Diagnosis present

## 2020-07-09 DIAGNOSIS — Z79899 Other long term (current) drug therapy: Secondary | ICD-10-CM

## 2020-07-09 LAB — COMPREHENSIVE METABOLIC PANEL
ALT: 36 U/L (ref 0–44)
AST: 60 U/L — ABNORMAL HIGH (ref 15–41)
Albumin: 4.3 g/dL (ref 3.5–5.0)
Alkaline Phosphatase: 246 U/L — ABNORMAL HIGH (ref 38–126)
Anion gap: 11 (ref 5–15)
BUN: 16 mg/dL (ref 8–23)
CO2: 26 mmol/L (ref 22–32)
Calcium: 9.2 mg/dL (ref 8.9–10.3)
Chloride: 104 mmol/L (ref 98–111)
Creatinine, Ser: 0.74 mg/dL (ref 0.44–1.00)
GFR, Estimated: >60 mL/min (ref >60)
Glucose, Bld: 117 mg/dL — ABNORMAL HIGH (ref 70–99)
Potassium: 3.5 mmol/L (ref 3.5–5.1)
Sodium: 141 mmol/L (ref 135–145)
Total Bilirubin: 1.9 mg/dL — ABNORMAL HIGH (ref 0.3–1.2)
Total Protein: 8 g/dL (ref 6.5–8.1)

## 2020-07-09 LAB — URINALYSIS, ROUTINE W REFLEX MICROSCOPIC
Bilirubin Urine: NEGATIVE
Glucose, UA: NEGATIVE mg/dL
Hgb urine dipstick: NEGATIVE
Ketones, ur: 5 mg/dL — AB
Leukocytes,Ua: NEGATIVE
Nitrite: NEGATIVE
Protein, ur: NEGATIVE mg/dL
Specific Gravity, Urine: 1.004 — ABNORMAL LOW (ref 1.005–1.030)
pH: 8 (ref 5.0–8.0)

## 2020-07-09 LAB — CBC WITH DIFFERENTIAL/PLATELET
Abs Immature Granulocytes: 0.01 10*3/uL (ref 0.00–0.07)
Basophils Absolute: 0 10*3/uL (ref 0.0–0.1)
Basophils Relative: 1 %
Eosinophils Absolute: 0.1 10*3/uL (ref 0.0–0.5)
Eosinophils Relative: 3 %
HCT: 46.3 % — ABNORMAL HIGH (ref 36.0–46.0)
Hemoglobin: 15.2 g/dL — ABNORMAL HIGH (ref 12.0–15.0)
Immature Granulocytes: 0 %
Lymphocytes Relative: 29 %
Lymphs Abs: 1 10*3/uL (ref 0.7–4.0)
MCH: 32.1 pg (ref 26.0–34.0)
MCHC: 32.8 g/dL (ref 30.0–36.0)
MCV: 97.9 fL (ref 80.0–100.0)
Monocytes Absolute: 0.5 10*3/uL (ref 0.1–1.0)
Monocytes Relative: 13 %
Neutro Abs: 2 10*3/uL (ref 1.7–7.7)
Neutrophils Relative %: 54 %
Platelets: 122 10*3/uL — ABNORMAL LOW (ref 150–400)
RBC: 4.73 MIL/uL (ref 3.87–5.11)
RDW: 13.1 % (ref 11.5–15.5)
WBC: 3.6 10*3/uL — ABNORMAL LOW (ref 4.0–10.5)
nRBC: 0 % (ref 0.0–0.2)

## 2020-07-09 LAB — TROPONIN I (HIGH SENSITIVITY): Troponin I (High Sensitivity): 8 ng/L (ref <18)

## 2020-07-09 MED ORDER — LEVOTHYROXINE SODIUM 88 MCG PO TABS: 88.0000 ug | ORAL_TABLET | Freq: Every day | ORAL | 0 refills | Status: DC

## 2020-07-09 NOTE — ED Provider Notes (Signed)
[facility] DEPT Provider Note   CSN: [id number] Arrival date & time: 07/09/20  1145     History Chief Complaint  Patient presents with  . Anxiety    Jaime Cox is a 87 y.o. female.  The history is provided by the patient, medical records and a relative.  Anxiety   Jaime Cox is a 87 y.o. female who presents to the Emergency Department complaining of shortness of breath. Level V caveat due to confusion. She presents to the emergency department complaining of shortness of breath that started 3 to 4 days ago. She has a history of recurrent episodes of similar symptoms. She has associated nausea. She denies any fevers, cough, chest pain, dull pain, dysuria, leg swelling or pain. She is from [place] but is currently visiting her son locally. She is unsure if she is compliant with her home medications. She is unsure what her medical problems are.    Past Medical History:  Diagnosis Date  . Anxiety     There are no problems to display for this patient.   History reviewed. No pertinent surgical history.   OB History   No obstetric history on file.     History reviewed. No pertinent family history.     Home Medications Prior to Admission medications   Medication Sig Start Date End Date Taking? Authorizing Provider  Ascorbic Acid (VITAMIN C) 1000 MG tablet Take 1,000 mg by mouth daily.   Yes [provider]  aspirin 81 MG chewable tablet Chew 81 mg by mouth daily.   Yes [provider]  buPROPion (WELLBUTRIN) 75 MG tablet Take 75 mg by mouth 2 (two) times daily. 05/13/20  Yes [provider]  carvedilol (COREG) 3.125 MG tablet Take 3.125 mg by mouth 2 (two) times daily with a meal. 05/13/20  Yes [provider]  cholecalciferol (VITAMIN D3) 25 MCG (1000 UNIT) tablet Take 1,000 Units by mouth daily.   Yes [provider]  Cyanocobalamin (VITAMIN B 12 PO) Take 1 tablet by mouth daily.   Yes  [provider]  doxylamine, Sleep, (UNISOM) 25 MG tablet Take 25 mg by mouth at bedtime as needed for sleep.   Yes [provider]  gabapentin (NEURONTIN) 300 MG capsule Take 300 mg by mouth at bedtime as needed (pain).   Yes [provider]  LORazepam (ATIVAN) 0.5 MG tablet Take 2 tablets (1 mg total) by mouth at bedtime. Patient taking differently: Take 0.25 mg by mouth at bedtime. 07/01/20  Yes Upstill, Nehemiah Settle, PA-C  pantoprazole (PROTONIX) 40 MG tablet Take 40 mg by mouth daily.   Yes [provider]  ranolazine (RANEXA) 500 MG 12 hr tablet Take 500 mg by mouth 2 (two) times daily.   Yes [provider]  levothyroxine (SYNTHROID) 88 MCG tablet Take 1 tablet (88 mcg total) by mouth daily before breakfast. 07/09/20   Quintella Reichert, MD    Allergies    Codeine  Review of Systems   Review of Systems  All other systems reviewed and are negative.   Physical Exam Updated Vital Signs BP (!) 140/94   Pulse 62   Temp 98.3 F (36.8 C) (Oral)   Resp 18   SpO2 97%   Physical Exam Vitals and nursing note reviewed.  Constitutional:      Appearance: She is well-developed and well-nourished.  HENT:     Head: Normocephalic and atraumatic.  Cardiovascular:     Rate and Rhythm: Normal rate and  regular rhythm.     Heart sounds: No murmur heard.   Pulmonary:     Effort: Pulmonary effort is normal. No respiratory distress.     Breath sounds: Normal breath sounds.  Abdominal:     Palpations: Abdomen is soft.     Tenderness: There is no abdominal tenderness. There is no guarding or rebound.  Musculoskeletal:        General: No tenderness or edema.  Skin:    General: Skin is warm and dry.  Neurological:     Mental Status: She is alert.     Comments: Oriented to person. Disoriented to time in recent events.  Psychiatric:        Mood and Affect: Mood and affect normal.     Comments: Anxious, mildly agitated.     ED Results / Procedures /  Treatments   Labs (all labs ordered are listed, but only abnormal results are displayed) Labs Reviewed  COMPREHENSIVE METABOLIC PANEL - Abnormal; Notable for the following components:      Result Value   Glucose, Bld 117 (*)    AST 60 (*)    Alkaline Phosphatase 246 (*)    Total Bilirubin 1.9 (*)    All other components within normal limits  CBC WITH DIFFERENTIAL/PLATELET - Abnormal; Notable for the following components:   WBC 3.6 (*)    Hemoglobin 15.2 (*)    HCT 46.3 (*)    Platelets 122 (*)    All other components within normal limits  URINALYSIS, ROUTINE W REFLEX MICROSCOPIC - Abnormal; Notable for the following components:   Color, Urine STRAW (*)    Specific Gravity, Urine 1.004 (*)    Ketones, ur 5 (*)    All other components within normal limits  SARS CORONAVIRUS 2 (TAT 6-24 HRS)  TROPONIN I (HIGH SENSITIVITY)    EKG None  Radiology DG Chest 2 View  Result Date: 07/09/2020 CLINICAL DATA:  Shortness of breath EXAM: CHEST - 2 VIEW COMPARISON:  Chest radiograph June 30, 2020. FINDINGS: The heart size and mediastinal contours are within normal limits. Aortic atherosclerosis. No focal consolidation. No pleural effusion or pneumothorax. Prior right humerus ORIF. IVC filter. IMPRESSION: No active cardiopulmonary disease. Electronically Signed   By: Dahlia Bailiff MD   On: 07/09/2020 16:41    Procedures Procedures (including critical care time)  Medications Ordered in ED Medications - No data to display  ED Course  I have reviewed the triage vital signs and the nursing notes.  Pertinent labs & imaging results that were available during my care of the patient were reviewed by me and considered in my medical decision making (see chart for details).    MDM Rules/Calculators/A&P                         patient here for evaluation of shortness of breath. She is confused. Initial history obtained from the patient. After initial assessment patients unavailable at the  bedside, who provides more history. The son states that she does have a history of confusion at baseline. She lives at times with his sister and other times with this brother. She has been living with him for the last week secondary to his brother having COVID. He is concerned about her medications because she has not been taking them properly. He states that she becomes very anxious when this happens. She was recently seen in the emergency department and started on Ativan, QHS for anxiety. When she took  the medication she slept for about two days. He is concerned that is too strong for her.  Patient is anxious on examination but non-toxic appearing. In terms of her shortness of breath, unclear if this is secondary to anxiety. No current evidence of pneumonia. Presentation is not consistent with ACS, PE, CHF. Discussed with patient and son medications in proper administration. Will refill her levothyroxine. Will also send COVID test that she recently had a COVID-19 exposure. Discussed PCP follow-up as well as return precautions.  Final Clinical Impression(s) / ED Diagnoses Final diagnoses:  Anxiety  Medication management    Rx / DC Orders ED Discharge Orders         Ordered    levothyroxine (SYNTHROID) 88 MCG tablet  Daily before breakfast        07/09/20 1731           Quintella Reichert, MD 07/09/20 1916

## 2020-07-09 NOTE — ED Triage Notes (Signed)
Pt presents with c/o anxiety, possible panic attack. Pt reports that she has a hx of anxiety, recently seen here for same. Pt denies any pain.

## 2020-07-09 NOTE — ED Notes (Signed)
Son at bedside.

## 2020-07-10 LAB — SARS CORONAVIRUS 2 (TAT 6-24 HRS): SARS Coronavirus 2: POSITIVE — AB

## 2020-07-14 ENCOUNTER — Emergency Department (HOSPITAL_COMMUNITY)
Admission: EM | Admit: 2020-07-14 | Discharge: 2020-07-14 | Disposition: A | Discharge status: Home / Self Care | Payer: Medicare Other | Attending: Emergency Medicine | Admitting: Emergency Medicine

## 2020-07-14 ENCOUNTER — Other Ambulatory Visit: Payer: Self-pay

## 2020-07-14 ENCOUNTER — Encounter (HOSPITAL_COMMUNITY): Payer: Self-pay | Admitting: Emergency Medicine

## 2020-07-14 DIAGNOSIS — Z7982 Long term (current) use of aspirin: Secondary | ICD-10-CM | POA: Insufficient documentation

## 2020-07-14 DIAGNOSIS — F410 Panic disorder [episodic paroxysmal anxiety] without agoraphobia: Secondary | ICD-10-CM | POA: Insufficient documentation

## 2020-07-14 MED ORDER — ONDANSETRON HCL 4 MG PO TABS: 4.0000 mg | ORAL_TABLET | Freq: Three times a day (TID) | ORAL | 0 refills | Status: DC | PRN

## 2020-07-14 MED ORDER — ONDANSETRON 8 MG PO TBDP
8.0000 mg | ORAL_TABLET | Freq: Once | ORAL | Status: AC
  Administered 2020-07-14: 8 mg via ORAL
  Filled 2020-07-14: qty 1

## 2020-07-14 MED ORDER — LORAZEPAM 0.5 MG PO TABS
0.5000 mg | ORAL_TABLET | Freq: Once | ORAL | Status: AC
  Administered 2020-07-14: 0.5 mg via ORAL
  Filled 2020-07-14: qty 1

## 2020-07-14 MED ORDER — LORAZEPAM 0.5 MG PO TABS: 1.0000 mg | ORAL_TABLET | Freq: Three times a day (TID) | ORAL | 2 refills | Status: AC | PRN

## 2020-07-14 NOTE — ED Provider Notes (Signed)
[facility] DEPT Provider Note   CSN: [id number] Arrival date & time: 07/14/20  1214     History Chief Complaint  Patient presents with  . Panic Attack    Jaime Cox is a 87 y.o. female.  HPI Patient here for treatment of suspected anxiety.  She has been seen twice in the ED and by her PCP after the last ED visit.  Initially from the ED, 2 weeks ago she was treated with Ativan 1 mg at at bedtime.  This made her very sleepy, and it was recommended that she take 1/2 mg, but she ran out of the pills before she could do that.  On her last ED visit, her levothyroxine was refilled.  The PCP that she saw did not make any other changes in her treatment plan.  She is here with her son who gives the history.  He states she goes through periods of time when she has trouble breathing and then sometimes vomits after that.  Patient tells me that she feels like she cannot take a deep breath, and also states that she feels like she "does not want to be here."  When asked about this, she stated she want to go back and live with another of her children.  The patient has been in the area, for 8 to 9 months, after moving here from [place].  She had an evaluation with her PCP in [place], and they help to get a refill on her levothyroxine, which had been previously ordered by the ED doctor.  There is been no fever, complaints of chest pain, complains of weakness or dizziness.  There are no other known modifying factors.    Past Medical History:  Diagnosis Date  . Anxiety     There are no problems to display for this patient.   History reviewed. No pertinent surgical history.   OB History   No obstetric history on file.     No family history on file.     Home Medications Prior to Admission medications   Medication Sig Start Date End Date Taking? Authorizing Provider  LORazepam (ATIVAN) 0.5 MG tablet Take 2 tablets (1 mg total) by mouth every 8 (eight) hours  as needed for anxiety. 07/14/20  Yes Daleen Bo, MD  ondansetron (ZOFRAN) 4 MG tablet Take 1 tablet (4 mg total) by mouth every 8 (eight) hours as needed for nausea or vomiting (or gagging). 07/14/20  Yes Daleen Bo, MD  Ascorbic Acid (VITAMIN C) 1000 MG tablet Take 1,000 mg by mouth daily.    [provider]  aspirin 81 MG chewable tablet Chew 81 mg by mouth daily.    [provider]  buPROPion (WELLBUTRIN) 75 MG tablet Take 75 mg by mouth 2 (two) times daily. 05/13/20   [provider]  carvedilol (COREG) 3.125 MG tablet Take 3.125 mg by mouth 2 (two) times daily with a meal. 05/13/20   [provider]  cholecalciferol (VITAMIN D3) 25 MCG (1000 UNIT) tablet Take 1,000 Units by mouth daily.    [provider]  Cyanocobalamin (VITAMIN B 12 PO) Take 1 tablet by mouth daily.    [provider]  doxylamine, Sleep, (UNISOM) 25 MG tablet Take 25 mg by mouth at bedtime as needed for sleep.    [provider]  gabapentin (NEURONTIN) 300 MG capsule Take 300 mg by mouth at bedtime as needed (pain).    [provider]  levothyroxine (SYNTHROID) 88 MCG tablet Take  1 tablet (88 mcg total) by mouth daily before breakfast. 07/09/20   Quintella Reichert, MD  pantoprazole (PROTONIX) 40 MG tablet Take 40 mg by mouth daily.    [provider]  ranolazine (RANEXA) 500 MG 12 hr tablet Take 500 mg by mouth 2 (two) times daily.    [provider]    Allergies    Codeine  Review of Systems   Review of Systems  All other systems reviewed and are negative.   Physical Exam Updated Vital Signs BP 120/73 (BP Location: Right Arm)   Pulse (!) 55   Temp 97.7 F (36.5 C) (Oral)   Resp (!) 22   Ht 5\' 3"  (1.6 m)   Wt 59.9 kg   SpO2 100%   BMI 23.38 kg/m   Physical Exam Vitals and nursing note reviewed.  Constitutional:      General: She is not in acute distress.    Appearance: She is well-developed and well-nourished.  She is not ill-appearing, toxic-appearing or diaphoretic.     Comments: Elderly, frail  HENT:     Head: Normocephalic and atraumatic.     Right Ear: External ear normal.     Left Ear: External ear normal.  Eyes:     Extraocular Movements: EOM normal.     Conjunctiva/sclera: Conjunctivae normal.     Pupils: Pupils are equal, round, and reactive to light.  Neck:     Trachea: Phonation normal.  Cardiovascular:     Rate and Rhythm: Normal rate and regular rhythm.     Heart sounds: Normal heart sounds.  Pulmonary:     Effort: Pulmonary effort is normal. No respiratory distress.     Breath sounds: Normal breath sounds. No stridor.     Comments: Few rales right base Chest:     Chest wall: No bony tenderness.  Abdominal:     Palpations: Abdomen is soft.     Tenderness: There is no abdominal tenderness.  Musculoskeletal:        General: No swelling or tenderness. Normal range of motion.     Cervical back: Normal range of motion and neck supple.     Right lower leg: No edema.     Left lower leg: No edema.  Skin:    General: Skin is warm, dry and intact.  Neurological:     Mental Status: She is alert.     Cranial Nerves: No cranial nerve deficit.     Motor: No abnormal muscle tone.     Coordination: Coordination normal.  Psychiatric:        Mood and Affect: Mood and affect and mood normal.        Behavior: Behavior normal.     ED Results / Procedures / Treatments   Labs (all labs ordered are listed, but only abnormal results are displayed) Labs Reviewed - No data to display  EKG None  Radiology No results found.  Procedures Procedures (including critical care time)  Medications Ordered in ED Medications  ondansetron (ZOFRAN-ODT) disintegrating tablet 8 mg (8 mg Oral Given 07/14/20 1358)  LORazepam (ATIVAN) tablet 0.5 mg (0.5 mg Oral Given 07/14/20 1357)    ED Course  I have reviewed the triage vital signs and the nursing notes.  Pertinent labs & imaging results  that were available during my care of the patient were reviewed by me and considered in my medical decision making (see chart for details).    MDM Rules/Calculators/A&P  Patient Vitals for the past 24 hrs:  BP Temp Temp src Pulse Resp SpO2 Height Weight  07/14/20 1401 -- -- -- (!) 55 (!) 22 100 % -- --  07/14/20 1221 120/73 97.7 F (36.5 C) Oral (!) 58 16 99 % 5\' 3"  (1.6 m) 59.9 kg    2:47 PM Reevaluation with update and discussion. After initial assessment and treatment, an updated evaluation reveals patient is much more comfortable after treatment here, findings discussed with patient and son in the room, all questions answered. Daleen Bo   Medical Decision Making:  This patient is presenting for evaluation of anxiety after running out of lorazepam, which does require a range of treatment options, and is a complaint that involves a moderate risk of morbidity and mortality. The differential diagnoses include anxiety, panic attack, pulmonary disorder. I decided to review old records, and in summary elderly female, living with her son, having difficulty with anxiety and feeling restless and homesick.  Her home is in [place] and she has been living in [place] for about 8 months.  I obtained additional historical information from son at bedside.    Critical Interventions-clinical evaluation, review of prior records, discussion with son and treated with medication.  After These Interventions, the Patient was reevaluated and was found improved and stable for discharge.  She is stable for discharge.  Symptoms improved with treatment using lorazepam and ondansetron.  These can be used as needed as needed until she gets back to her home, or follow-up with her PCP for further care.  CRITICAL CARE-no Performed by: Daleen Bo  Nursing Notes Reviewed/ Care Coordinated Applicable Imaging Reviewed Interpretation of Laboratory Data incorporated into ED  treatment  The patient appears reasonably screened and/or stabilized for discharge and I doubt any other medical condition or other [facility] requiring further screening, evaluation, or treatment in the ED at this time prior to discharge.  Plan: Home Medications-continue current; Home Treatments-rest, fluids; return here if the recommended treatment, does not improve the symptoms; Recommended follow up-PCP, as needed     Final Clinical Impression(s) / ED Diagnoses Final diagnoses:  Panic attack    Rx / DC Orders ED Discharge Orders         Ordered    LORazepam (ATIVAN) 0.5 MG tablet  Every 8 hours PRN        07/14/20 1447    ondansetron (ZOFRAN) 4 MG tablet  Every 8 hours PRN        07/14/20 1447           Daleen Bo, MD 07/14/20 1450

## 2020-07-14 NOTE — ED Notes (Signed)
Patient assisted to fast track treatment room chair. Symptoms have resolved after arrival to ED. Son reports this is typical behavior.

## 2020-07-14 NOTE — ED Triage Notes (Addendum)
Patient BIB son. Anxiety attack since this morning. Hx of same. States hyperventilation with tingling and nausea which mimic typical anxiety attack.  Son reports prescribed lorazepam makes patient too drowsy.

## 2020-07-14 NOTE — Discharge Instructions (Addendum)
Use the lorazepam up to 3 times a day as needed for anxiety.  Use the ondansetron for nausea or vomiting, or to help with the gagging.  Return here, if needed, for problems.

## 2020-09-10 ENCOUNTER — Emergency Department (HOSPITAL_COMMUNITY)
Admission: EM | Admit: 2020-09-10 | Discharge: 2020-09-10 | Disposition: A | Discharge status: Home / Self Care | Payer: Medicare Other | Attending: Emergency Medicine | Admitting: Emergency Medicine

## 2020-09-10 ENCOUNTER — Other Ambulatory Visit: Payer: Self-pay

## 2020-09-10 ENCOUNTER — Emergency Department (HOSPITAL_COMMUNITY): Payer: Medicare Other

## 2020-09-10 ENCOUNTER — Encounter (HOSPITAL_COMMUNITY): Payer: Self-pay

## 2020-09-10 DIAGNOSIS — Z87891 Personal history of nicotine dependence: Secondary | ICD-10-CM | POA: Insufficient documentation

## 2020-09-10 DIAGNOSIS — Z7982 Long term (current) use of aspirin: Secondary | ICD-10-CM | POA: Diagnosis not present

## 2020-09-10 DIAGNOSIS — D696 Thrombocytopenia, unspecified: Secondary | ICD-10-CM | POA: Diagnosis not present

## 2020-09-10 DIAGNOSIS — K2960 Other gastritis without bleeding: Secondary | ICD-10-CM | POA: Diagnosis not present

## 2020-09-10 DIAGNOSIS — R110 Nausea: Secondary | ICD-10-CM | POA: Diagnosis present

## 2020-09-10 DIAGNOSIS — R42 Dizziness and giddiness: Secondary | ICD-10-CM | POA: Diagnosis not present

## 2020-09-10 DIAGNOSIS — K2900 Acute gastritis without bleeding: Secondary | ICD-10-CM

## 2020-09-10 LAB — CBC
HCT: 40.4 % (ref 36.0–46.0)
Hemoglobin: 13.4 g/dL (ref 12.0–15.0)
MCH: 33.8 pg (ref 26.0–34.0)
MCHC: 33.2 g/dL (ref 30.0–36.0)
MCV: 101.8 fL — ABNORMAL HIGH (ref 80.0–100.0)
Platelets: 116 10*3/uL — ABNORMAL LOW (ref 150–400)
RBC: 3.97 MIL/uL (ref 3.87–5.11)
RDW: 12.9 % (ref 11.5–15.5)
WBC: 4 10*3/uL (ref 4.0–10.5)
nRBC: 0 % (ref 0.0–0.2)

## 2020-09-10 LAB — COMPREHENSIVE METABOLIC PANEL
ALT: 14 U/L (ref 0–44)
AST: 21 U/L (ref 15–41)
Albumin: 3.8 g/dL (ref 3.5–5.0)
Alkaline Phosphatase: 118 U/L (ref 38–126)
Anion gap: 8 (ref 5–15)
BUN: 19 mg/dL (ref 8–23)
CO2: 26 mmol/L (ref 22–32)
Calcium: 8.7 mg/dL — ABNORMAL LOW (ref 8.9–10.3)
Chloride: 106 mmol/L (ref 98–111)
Creatinine, Ser: 0.95 mg/dL (ref 0.44–1.00)
GFR, Estimated: 58 mL/min — ABNORMAL LOW (ref >60)
Glucose, Bld: 139 mg/dL — ABNORMAL HIGH (ref 70–99)
Potassium: 4.1 mmol/L (ref 3.5–5.1)
Sodium: 140 mmol/L (ref 135–145)
Total Bilirubin: 1.2 mg/dL (ref 0.3–1.2)
Total Protein: 6.8 g/dL (ref 6.5–8.1)

## 2020-09-10 LAB — URINALYSIS, ROUTINE W REFLEX MICROSCOPIC
Bilirubin Urine: NEGATIVE
Glucose, UA: NEGATIVE mg/dL
Hgb urine dipstick: NEGATIVE
Ketones, ur: NEGATIVE mg/dL
Leukocytes,Ua: NEGATIVE
Nitrite: NEGATIVE
Protein, ur: NEGATIVE mg/dL
Specific Gravity, Urine: 1.026 (ref 1.005–1.030)
pH: 5 (ref 5.0–8.0)

## 2020-09-10 LAB — LIPASE, BLOOD: Lipase: 35 U/L (ref 11–51)

## 2020-09-10 MED ORDER — SUCRALFATE 1 G PO TABS: 1.0000 g | ORAL_TABLET | Freq: Two times a day (BID) | ORAL | 0 refills | Status: AC

## 2020-09-10 MED ORDER — ONDANSETRON HCL 4 MG/2ML IJ SOLN
4.0000 mg | Freq: Once | INTRAMUSCULAR | Status: AC
  Administered 2020-09-10: 4 mg via INTRAVENOUS
  Filled 2020-09-10: qty 2

## 2020-09-10 MED ORDER — IOHEXOL 300 MG/ML  SOLN
100.0000 mL | Freq: Once | INTRAMUSCULAR | Status: AC | PRN
  Administered 2020-09-10: 80 mL via INTRAVENOUS

## 2020-09-10 MED ORDER — SODIUM CHLORIDE 0.9 % IV BOLUS
1000.0000 mL | Freq: Once | INTRAVENOUS | Status: AC
  Administered 2020-09-10: 1000 mL via INTRAVENOUS

## 2020-09-10 NOTE — ED Notes (Signed)
Patients son in room

## 2020-09-10 NOTE — ED Notes (Signed)
Patient transported to CT 

## 2020-09-10 NOTE — ED Provider Notes (Signed)
[facility] DEPT Provider Note   CSN: [id number] Arrival date & time: 09/10/20  1532     History Chief Complaint  Patient presents with  . Dizziness  . Abnormal Lab  . Nausea    Jaime Cox is a 87 y.o. female with past medical history of anxiety that presents emerge department today for dizziness and nausea.  Patient is present with son who adds to HPI.  They state that patient has been feeling off for the past couple of months, has been feeling nauseous and lost about 15 pounds.  Went to her PCP who did basic blood work and noticed that she has had a thrombocytopenia.  They put her on Ensure, patient states that today she felt dizzy which is why she primarily came here.  States that she was cooking dinner, felt dizzy and had to lay down.  Patient states that she was unable to walk due to her dizziness.  States that dizziness felt as if she felt lightheaded, denies any room spinning sensation.  Lasted a couple minutes. States that this happened twice today, son states this is new, however patient states this is happened in the past.  Denies any pain anywhere.  Denies any headache, vision changes, neck pain.  Did not feel as if she was going to pass out, denies any palpitations chest pain or shortness of breath.  Denies any fevers.  Denies any abdominal pain, however when I press on her abdomen she does have pain.  Denies any actual vomiting, however son states that he thinks that she vomited yesterday.  Last bowel movement yesterday, states that it was normal for her.  States that she is not dizzy currently.  States that she feels fine right now except for she does feel nauseous, however states that this is not new for her for the past couple of months.  Does take Zofran over-the-counter as needed. NO pain.   HPI     Past Medical History:  Diagnosis Date  . Anxiety     There are no problems to display for this patient.   History reviewed. No pertinent  surgical history.   OB History   No obstetric history on file.     Family History  Family history unknown: Yes    Social History   Tobacco Use  . Smoking status: Former Research scientist (life sciences)  . Smokeless tobacco: Never Used  Vaping Use  . Vaping Use: Never used  Substance Use Topics  . Alcohol use: Never  . Drug use: Never    Home Medications Prior to Admission medications   Medication Sig Start Date End Date Taking? Authorizing Provider  aspirin 81 MG chewable tablet Chew 81 mg by mouth daily.   Yes [provider]  buPROPion (WELLBUTRIN) 75 MG tablet Take 75 mg by mouth 2 (two) times daily. 05/13/20  Yes [provider]  carvedilol (COREG) 3.125 MG tablet Take 3.125 mg by mouth 2 (two) times daily with a meal. 05/13/20  Yes [provider]  levothyroxine (SYNTHROID) 75 MCG tablet Take 75 mcg by mouth daily before breakfast.   Yes [provider]  LORazepam (ATIVAN) 0.5 MG tablet Take 2 tablets (1 mg total) by mouth every 8 (eight) hours as needed for anxiety. 07/14/20  Yes Daleen Bo, MD  ondansetron (ZOFRAN) 4 MG tablet Take 1 tablet (4 mg total) by mouth every 8 (eight) hours as needed for nausea or vomiting (or gagging). 07/14/20  Yes Daleen Bo, MD  pantoprazole (  PROTONIX) 40 MG tablet Take 40 mg by mouth daily.   Yes [provider]  ranolazine (RANEXA) 500 MG 12 hr tablet Take 500 mg by mouth 2 (two) times daily.   Yes [provider]  rosuvastatin (CRESTOR) 5 MG tablet Take 5 mg by mouth daily.   Yes [provider]  sucralfate (CARAFATE) 1 g tablet Take 1 tablet (1 g total) by mouth 2 (two) times daily with a meal. 09/10/20  Yes Alfredia Client, PA-C  levothyroxine (SYNTHROID) 88 MCG tablet Take 1 tablet (88 mcg total) by mouth daily before breakfast. Patient not taking: No sig reported 07/09/20   Quintella Reichert, MD    Allergies    Codeine  Review of Systems   Review of Systems  Constitutional: Negative for  chills, diaphoresis, fatigue and fever.  HENT: Negative for congestion, sore throat and trouble swallowing.   Eyes: Negative for pain and visual disturbance.  Respiratory: Negative for cough, shortness of breath and wheezing.   Cardiovascular: Negative for chest pain, palpitations and leg swelling.  Gastrointestinal: Positive for nausea. Negative for abdominal distention, abdominal pain, diarrhea and vomiting.  Genitourinary: Negative for difficulty urinating.  Musculoskeletal: Negative for back pain, neck pain and neck stiffness.  Skin: Negative for pallor.  Neurological: Positive for dizziness. Negative for speech difficulty, weakness and headaches.  Psychiatric/Behavioral: Negative for confusion.    Physical Exam Updated Vital Signs BP (!) 151/65   Pulse 63   Temp 97.9 F (36.6 C) (Oral)   Resp 13   Ht 5' 3.5" (1.613 m)   Wt 51.3 kg   SpO2 96%   BMI 19.70 kg/m   Physical Exam Constitutional:      General: She is not in acute distress.    Appearance: Normal appearance. She is not ill-appearing, toxic-appearing or diaphoretic.  HENT:     Mouth/Throat:     Mouth: Mucous membranes are moist.     Pharynx: Oropharynx is clear.  Eyes:     General: No scleral icterus.    Extraocular Movements: Extraocular movements intact.     Pupils: Pupils are equal, round, and reactive to light.  Cardiovascular:     Rate and Rhythm: Normal rate and regular rhythm.     Pulses: Normal pulses.     Heart sounds: Normal heart sounds.  Pulmonary:     Effort: Pulmonary effort is normal. No respiratory distress.     Breath sounds: Normal breath sounds. No stridor. No wheezing, rhonchi or rales.  Chest:     Chest wall: No tenderness.  Abdominal:     General: Abdomen is flat. There is no distension.     Palpations: Abdomen is soft.     Tenderness: There is abdominal tenderness. There is guarding. There is no rebound.    Musculoskeletal:        General: No swelling or tenderness. Normal range  of motion.     Cervical back: Normal range of motion and neck supple. No rigidity.     Right lower leg: No edema.     Left lower leg: No edema.  Skin:    General: Skin is warm and dry.     Capillary Refill: Capillary refill takes less than 2 seconds.     Coloration: Skin is not pale.  Neurological:     General: No focal deficit present.     Mental Status: She is alert and oriented to person, place, and time.     Comments: Alert. Clear speech. No facial droop. CNIII-XII  grossly intact. Bilateral upper and lower extremities' sensation grossly intact. 5/5 symmetric strength with grip strength and with plantar and dorsi flexion bilaterally.  Normal finger to nose bilaterally. Negative pronator drift. Negative Romberg sign. Gait is steady and intact    Psychiatric:        Mood and Affect: Mood normal.        Behavior: Behavior normal.     ED Results / Procedures / Treatments   Labs (all labs ordered are listed, but only abnormal results are displayed) Labs Reviewed  COMPREHENSIVE METABOLIC PANEL - Abnormal; Notable for the following components:      Result Value   Glucose, Bld 139 (*)    Calcium 8.7 (*)    GFR, Estimated 58 (*)    All other components within normal limits  CBC - Abnormal; Notable for the following components:   MCV 101.8 (*)    Platelets 116 (*)    All other components within normal limits  URINALYSIS, ROUTINE W REFLEX MICROSCOPIC - Abnormal; Notable for the following components:   Color, Urine AMBER (*)    All other components within normal limits  LIPASE, BLOOD    EKG None  Radiology CT Abdomen Pelvis W Contrast  Result Date: 09/10/2020 CLINICAL DATA:  Abdominal pain. EXAM: CT ABDOMEN AND PELVIS WITH CONTRAST TECHNIQUE: Multidetector CT imaging of the abdomen and pelvis was performed using the standard protocol following bolus administration of intravenous contrast. CONTRAST:  41mL OMNIPAQUE IOHEXOL 300 MG/ML  SOLN COMPARISON:  None. FINDINGS: Lower chest:  Bronchiectasis and scarring noted in the lower lobes bilaterally, right greater than left. Tree-in-bud nodularity in the right lower lobe is compatible with atypical infection. 5 mm nodular component on image 30/4. Hepatobiliary: No suspicious focal abnormality within the liver parenchyma. Nodular hepatic contour suggests cirrhosis. There is no evidence for gallstones, gallbladder wall thickening, or pericholecystic fluid. No intrahepatic or extrahepatic biliary dilation. Pancreas: No focal mass lesion. No dilatation of the main duct. No intraparenchymal cyst. No peripancreatic edema. Spleen: No splenomegaly. No focal mass lesion. Adrenals/Urinary Tract: No adrenal nodule or mass. No suspicious lesion identified in either kidney. 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 The urinary bladder appears normal for the degree of distention. Stomach/Bowel: Stomach is unremarkable. No gastric wall thickening. No evidence of outlet obstruction. Mild wall thickening in the gastric antrum is indeterminate and nonspecific. Duodenum is normally positioned as is the ligament of Treitz. No small bowel wall thickening. No small bowel dilatation. The terminal ileum is normal. The appendix is best seen on coronal images and is unremarkable. Diverticuli are seen scattered along the entire length of the colon without CT findings of diverticulitis. Vascular/Lymphatic: There is abdominal aortic atherosclerosis without aneurysm. IVC filter identified in situ. There is no gastrohepatic or hepatoduodenal ligament lymphadenopathy. No retroperitoneal or mesenteric lymphadenopathy. No pelvic sidewall lymphadenopathy. Reproductive: The uterus is surgically absent. There is no adnexal mass. Other: No intraperitoneal free fluid. Musculoskeletal: No worrisome lytic or sclerotic osseous abnormality. IMPRESSION: 1. Mild wall thickening in the gastric antrum is indeterminate and  nonspecific. Gastritis a possibility. 2. Nodular hepatic contour suggests cirrhosis. 3. Bronchiectasis and scarring in the lower lobes bilaterally, right greater than left. Tree-in-bud nodularity in the right lower lobe is compatible with atypical infection. 4. Diffuse colonic diverticulosis without diverticulitis. 5. Aortic Atherosclerosis (ICD10-I70.0). Electronically Signed   By: Misty Stanley M.D.   On: 09/10/2020 19:05    Procedures Procedures   Medications Ordered  in ED Medications  sodium chloride 0.9 % bolus 1,000 mL (0 mLs Intravenous Stopped 09/10/20 1936)  ondansetron (ZOFRAN) injection 4 mg (4 mg Intravenous Given 09/10/20 1742)  iohexol (OMNIPAQUE) 300 MG/ML solution 100 mL (80 mLs Intravenous Contrast Given 09/10/20 1812)    ED Course  I have reviewed the triage vital signs and the nursing notes.  Pertinent labs & imaging results that were available during my care of the patient were reviewed by me and considered in my medical decision making (see chart for details).    MDM Rules/Calculators/A&P                         Jaime Cox is a 87 y.o. female with past medical history of anxiety that presents emerge department today for dizziness and nausea.  Patient with normal neuro exam and normal gait.  Work-up today unremarkable, normal CBC CMP, urinalysis and lipase.  CBC does show chronic thrombocytopenia.  CT abdomen does show potential gastritis, this would make sense with physical exam findings.  Patient is already on a PPI, will add Carafate and GI referral since patient is nauseous and has been losing weight.  CT also does have some incidental findings, did go over this with patient and son.  In regards to dizziness, has resolved, 2 episodes today, nonvertiginous with normal neuro exam.  I think dizziness is most likely from low p.o. intake due to nausea and weight loss.  Negative orthostatics.  Hemoglobin stable.  Patient be discharged and follow-up with PCP in regards to  dizziness today, dizziness is not vertiginous, low concerns for central causes.  No presyncopal event.  EKG in sinus rhythm.  Doubt need for further emergent work up at this time. I explained the diagnosis and have given explicit precautions to return to the ER including for any other new or worsening symptoms. The patient understands and accepts the medical plan as it's been dictated and I have answered their questions. Discharge instructions concerning home care and prescriptions have been given. The patient is STABLE and is discharged to home in good condition.  I discussed this case with my attending physician who cosigned this note including patient's presenting symptoms, physical exam, and planned diagnostics and interventions. Attending physician stated agreement with plan or made changes to plan which were implemented.   Final Clinical Impression(s) / ED Diagnoses Final diagnoses:  Other acute gastritis without hemorrhage  Dizziness    Rx / DC Orders ED Discharge Orders         Ordered    sucralfate (CARAFATE) 1 g tablet  2 times daily with meals        09/10/20 1928           Alfredia Client, PA-C 09/10/20 2015    Davonna Belling, MD 09/10/20 2258

## 2020-09-10 NOTE — ED Triage Notes (Addendum)
Patient c/o dizziness and nausea. Patient denies falling or having LOC, or near syncope.  Patient states that she has low platelets.

## 2020-09-10 NOTE — Discharge Instructions (Signed)
Your work-up today was reassuring, I want you to follow-up with the GI doctor, please schedule an appointment with.  I also added Carafate, continue take that with meals in addition to your pantoprazole.  Use the attached instructions.  I also printed out your CT for you which is below, please go over this with your PCP for the incidental findings including the bronchiectasis.  If you have any new worsening concerning symptom please come back to the emergency department.  The Covid test will result in the next 24 hours, he can keep an eye on this on MyChart.  Continue to take the Zofran as directed on the bottle for nausea.  Make sure to stay hydrated and eat.  If she has any new or worsening concerning symptom please come back to the emergency department.  Stay away from NSAIDs.     IMPRESSION:  1. Mild wall thickening in the gastric antrum is indeterminate and  nonspecific. Gastritis a possibility.  2. Nodular hepatic contour suggests cirrhosis.  3. Bronchiectasis and scarring in the lower lobes bilaterally, right  greater than left. Tree-in-bud nodularity in the right lower lobe is  compatible with atypical infection.  4. Diffuse colonic diverticulosis without diverticulitis.  5. Aortic Atherosclerosis (ICD10-I70.0).   Get help right away if: You throw up blood or something that looks like coffee grounds. You have black or dark red poop. You throw up any time you try to drink fluids. Your stomach pain gets worse. You have a fever. You do not feel better after one week.

## 2020-10-01 ENCOUNTER — Encounter: Payer: Self-pay | Admitting: Physician Assistant

## 2020-10-01 ENCOUNTER — Other Ambulatory Visit: Payer: Self-pay

## 2020-10-01 ENCOUNTER — Ambulatory Visit (INDEPENDENT_AMBULATORY_CARE_PROVIDER_SITE_OTHER): Payer: Medicare Other | Admitting: Physician Assistant

## 2020-10-01 ENCOUNTER — Other Ambulatory Visit (INDEPENDENT_AMBULATORY_CARE_PROVIDER_SITE_OTHER): Payer: Medicare Other

## 2020-10-01 VITALS — BP 118/70 | HR 61 | Ht 63.5 in | Wt 111.0 lb

## 2020-10-01 DIAGNOSIS — K7460 Unspecified cirrhosis of liver: Secondary | ICD-10-CM

## 2020-10-01 DIAGNOSIS — F419 Anxiety disorder, unspecified: Secondary | ICD-10-CM | POA: Insufficient documentation

## 2020-10-01 DIAGNOSIS — Z7409 Other reduced mobility: Secondary | ICD-10-CM | POA: Insufficient documentation

## 2020-10-01 DIAGNOSIS — R319 Hematuria, unspecified: Secondary | ICD-10-CM

## 2020-10-01 DIAGNOSIS — R110 Nausea: Secondary | ICD-10-CM

## 2020-10-01 DIAGNOSIS — D696 Thrombocytopenia, unspecified: Secondary | ICD-10-CM | POA: Insufficient documentation

## 2020-10-01 DIAGNOSIS — R9389 Abnormal findings on diagnostic imaging of other specified body structures: Secondary | ICD-10-CM | POA: Diagnosis not present

## 2020-10-01 DIAGNOSIS — R269 Unspecified abnormalities of gait and mobility: Secondary | ICD-10-CM | POA: Insufficient documentation

## 2020-10-01 DIAGNOSIS — R634 Abnormal weight loss: Secondary | ICD-10-CM | POA: Insufficient documentation

## 2020-10-01 LAB — URINALYSIS
Bilirubin Urine: NEGATIVE
Hgb urine dipstick: NEGATIVE
Ketones, ur: NEGATIVE
Leukocytes,Ua: NEGATIVE
Nitrite: NEGATIVE
Specific Gravity, Urine: 1.025 (ref 1.000–1.030)
Total Protein, Urine: NEGATIVE
Urine Glucose: NEGATIVE
Urobilinogen, UA: 4 — AB (ref 0.0–1.0)
pH: 5.5 (ref 5.0–8.0)

## 2020-10-01 MED ORDER — ONDANSETRON HCL 4 MG PO TABS: 4.0000 mg | ORAL_TABLET | Freq: Three times a day (TID) | ORAL | 3 refills | Status: AC | PRN

## 2020-10-01 MED ORDER — PANTOPRAZOLE SODIUM 40 MG PO TBEC: 40.0000 mg | DELAYED_RELEASE_TABLET | Freq: Every day | ORAL | 3 refills | Status: AC

## 2020-10-01 NOTE — Patient Instructions (Addendum)
If you are age 85 or older, your body mass index should be between 23-30. Your Body mass index is 19.35 kg/m. If this is out of the aforementioned range listed, please consider follow up with your Primary Care Provider.  If you are age 73 or younger, your body mass index should be between 19-25. Your Body mass index is 19.35 kg/m. If this is out of the aformentioned range listed, please consider follow up with your Primary Care Provider.   Your provider has requested that you go to the basement level for lab work before leaving today. Press "B" on the elevator. The lab is located at the first door on the left as you exit the elevator.  Continue Ondansetron and Pantoprazole. Refills have been sent to your pharmacy.  Drink Ensure twice daily between meals. Eat three meals daily.  Follow up as needed.  Thank you for entrusting me with your care and choosing [facility].  Amy Esterwood, PA-C

## 2020-10-01 NOTE — Progress Notes (Signed)
Subjective:    Patient ID: Jaime Cox, female    DOB: 19-Aug-1931, 87 y.o.   MRN: [id number]  HPI Jaime Cox is a pleasant 87 year old white female, new to GI today referred by the [facility] emergency room after recent visit there on 09/10/2020 with complaints of nausea, some dizziness and recent weight loss. Patient has been staying with her son over the past few months.  She resides in [facility] and plan is for her to return there in early June. Patient had a couple of ER visits in early January related to issues with anxiety.  Patient had lost her husband in 2019 and lost her daughter in 2018.  She has had some chronic issues by report with anxiety, depression since that time.  Her son feels that she was having increased struggles with anxiety and perhaps grief regarding her daughter's death earlier this year. She has not had any prior GI issues. CT of the abdomen pelvis was done on 09/10/2020 which did show evidence of bronchiectasis and some tree-in-bud nodularity in the right lower lobe, question atypical infection.  She is noted to have nodular liver consistent with cirrhosis, no splenomegaly, extensive diverticulosis and had some mild gastric wall thickening noted.  They were the told by the ER that she may have gastritis.  She was started on Protonix 40 mg daily and has been given Zofran to use as needed for nausea.  She also has Carafate on her list though it sounds as if she may have completed this. Neither the patient or her son were entirely clear about her medications today.  She denies any regular aspirin or NSAID use. She says she has not been having any vomiting over the past couple of weeks and says that the nausea still comes and goes a bit.  She feels that her appetite has been better.  They are trying to get her to drink Ensure twice daily.  Patient denies any abdominal pain, she has not noted any melena or hematochezia. She says previously she was having problems with  appetite and said she would just look at food and become nauseated.  She does admit that she gets anxious and worries on a regular basis.  She denies any heartburn or indigestion no dysphagia or odynophagia.  She has had a gradual weight loss apparently from 1 30-1 12 over the past several months. Patient and son unaware of any prior diagnosis of liver disease or cirrhosis, no prior EtOH use. Labs on 09/10/2020 LFTs within normal limits, creatinine 0.95 hemoglobin 13.4 hematocrit of 40 and platelets of 116.  UA was done and negative.  Patient overall is a poor historian though very pleasant.  She apparently has been noticing a small amount of blood on the tissue with urination over the past couple of days.  Her son says that she may have been noticing this for longer but has just mentioned this to them.  She denies any lower abdominal discomfort or dysuria.  Review of Systems Pertinent positive and negative review of systems were noted in the above HPI section.  All other review of systems was otherwise negative.  Outpatient Encounter Medications as of 10/01/2020  Medication Sig  . aspirin 81 MG chewable tablet Chew 81 mg by mouth daily.  Marland Kitchen buPROPion (WELLBUTRIN) 75 MG tablet Take 75 mg by mouth 2 (two) times daily.  . carvedilol (COREG) 3.125 MG tablet Take 3.125 mg by mouth 2 (two) times daily with a meal.  . levothyroxine (SYNTHROID)  75 MCG tablet Take 75 mcg by mouth daily before breakfast.  . LORazepam (ATIVAN) 0.5 MG tablet Take 2 tablets (1 mg total) by mouth every 8 (eight) hours as needed for anxiety.  . ranolazine (RANEXA) 500 MG 12 hr tablet Take 500 mg by mouth 2 (two) times daily.  . rosuvastatin (CRESTOR) 5 MG tablet Take 5 mg by mouth daily.  . sucralfate (CARAFATE) 1 g tablet Take 1 tablet (1 g total) by mouth 2 (two) times daily with a meal.  . [DISCONTINUED] ondansetron (ZOFRAN) 4 MG tablet Take 1 tablet (4 mg total) by mouth every 8 (eight) hours as needed for nausea or vomiting (or  gagging).  . [DISCONTINUED] pantoprazole (PROTONIX) 40 MG tablet Take 40 mg by mouth daily.  . ondansetron (ZOFRAN) 4 MG tablet Take 1 tablet (4 mg total) by mouth every 8 (eight) hours as needed for nausea or vomiting (or gagging).  . pantoprazole (PROTONIX) 40 MG tablet Take 1 tablet (40 mg total) by mouth daily before breakfast.  . [DISCONTINUED] levothyroxine (SYNTHROID) 88 MCG tablet Take 1 tablet (88 mcg total) by mouth daily before breakfast. (Patient not taking: No sig reported)   No facility-administered encounter medications on file as of 10/01/2020.   Allergies  Allergen Reactions  . Codeine Other (See Comments)    unknown   Patient Active Problem List   Diagnosis Date Noted  . Abnormal gait 10/01/2020  . Anxiety 10/01/2020  . Unintentional weight loss 10/01/2020  . Thrombocytopenia ([place]) 10/01/2020  . Poor mobility 10/01/2020  . Dementia ([place]) 07/27/2019  . History of fall 07/27/2019  . Non-thrombocytopenic purpura ([place]) 07/27/2019  . Age-related osteoporosis without current pathological fracture 06/13/2019  . Amnesia 06/13/2019  . Atherosclerotic heart disease of native coronary artery without angina pectoris 06/13/2019  . Essential hypertension 06/13/2019  . Hypothyroidism 06/13/2019  . Personal history of other diseases of the circulatory system 06/13/2019   Social History   Socioeconomic History  . Marital status: Widowed    Spouse name: Not on file  . Number of children: Not on file  . Years of education: Not on file  . Highest education level: Not on file  Occupational History  . Not on file  Tobacco Use  . Smoking status: Former Research scientist (life sciences)  . Smokeless tobacco: Never Used  Vaping Use  . Vaping Use: Never used  Substance and Sexual Activity  . Alcohol use: Never  . Drug use: Never  . Sexual activity: Not on file  Other Topics Concern  . Not on file  Social History Narrative  . Not on file   Social Determinants of Health   Financial Resource Strain:  Not on file  Food Insecurity: Not on file  Transportation Needs: Not on file  Physical Activity: Not on file  Stress: Not on file  Social Connections: Not on file  Intimate Partner Violence: Not on file    Ms. Claycomb's family history is not on file.      Objective:    Vitals:   10/01/20 1522  BP: 118/70  Pulse: 61  SpO2: 92%    Physical Exam Well-developed thin very elderly white female in no acute distress.  Accompanied by her son  Weight, 111 BMI 19.3  HEENT; nontraumatic normocephalic, EOMI, PE R LA, sclera anicteric. Oropharynx; not examined today Neck; supple, no JVD Cardiovascular; regular rate and rhythm with S1-S2, no murmur rub or gallop Pulmonary; Clear bilaterally Abdomen; soft, nontender, nondistended, no palpable mass or hepatosplenomegaly, bowel sounds are active  Rectal; not done today Skin; benign exam, no jaundice rash or appreciable lesions Extremities; no clubbing cyanosis or edema skin warm and dry Neuro/Psych; alert and oriented x4, grossly nonfocal mood and affect appropriate.  Patient does look to her son to help answer question       Assessment & Plan:   #61 87 year old white female referred after recent ER visit on 09/10/2020 with nausea, decrease in appetite and weight loss. I suspect this is multifactorial as she had also had 3 previous ER visits earlier this year with issues with anxiety etc.  She is staying with family in [place] over the past few months and is to return to her home in [place] in early June.  She has had loss of a daughter and her husband over the past couple of years.  CT imaging showed a nodular liver consistent with cirrhosis as well as mild gastric wall thickening.  Also noted evidence of bronchiectasis  Patient symptoms seem to be improving over the last few weeks with the use of Zofran and Protonix.  She feels that she is eating better and her appetite has improved she denies any recent vomiting.  Rule out  gastritis, rule out peptic ulcer disease, rule out occult lesion.  #2 cirrhosis with mild thrombocytopenia.-No prior history of liver disease to their knowledge. #3 chronic anxiety/depression #3 possible hematuria  Plan; continue Protonix 40 mg p.o. every morning, refills have been sent Continue Zofran 4 mg every 8 hours as needed for nausea, refill sent. Check UA today I discussed pursuing further work-up of the liver with labs, and also discussed EGD for further evaluation of the gastric wall thickening noted on CT.  Patient is not interested in pursuing any further work-up at this time.  Her son relates that they will continue to monitor her, work with helping her increase p.o. intake, and aim towards some weight gain.  They do not wish to pursue EGD at this time.  Plans to Have Her Follow-Up with Her PCP in [place] When She Returns Home in Early June. Happy to see her in follow-up if she has any change in status over the next month, or worsening of symptoms.  Arayna Illescas S Blessed Cotham PA-C 10/01/2020   Cc: Sueanne Margarita, DO

## 2020-10-17 ENCOUNTER — Emergency Department (HOSPITAL_COMMUNITY)
Admission: EM | Admit: 2020-10-17 | Discharge: 2020-10-17 | Disposition: A | Discharge status: Home / Self Care | Payer: Medicare Other | Attending: Emergency Medicine | Admitting: Emergency Medicine

## 2020-10-17 ENCOUNTER — Encounter (HOSPITAL_COMMUNITY): Payer: Self-pay

## 2020-10-17 ENCOUNTER — Emergency Department (HOSPITAL_COMMUNITY): Payer: Medicare Other

## 2020-10-17 ENCOUNTER — Other Ambulatory Visit: Payer: Self-pay

## 2020-10-17 DIAGNOSIS — I2510 Atherosclerotic heart disease of native coronary artery without angina pectoris: Secondary | ICD-10-CM | POA: Insufficient documentation

## 2020-10-17 DIAGNOSIS — Z859 Personal history of malignant neoplasm, unspecified: Secondary | ICD-10-CM | POA: Diagnosis not present

## 2020-10-17 DIAGNOSIS — Y9389 Activity, other specified: Secondary | ICD-10-CM | POA: Insufficient documentation

## 2020-10-17 DIAGNOSIS — I10 Essential (primary) hypertension: Secondary | ICD-10-CM | POA: Insufficient documentation

## 2020-10-17 DIAGNOSIS — E039 Hypothyroidism, unspecified: Secondary | ICD-10-CM | POA: Diagnosis not present

## 2020-10-17 DIAGNOSIS — W010XXA Fall on same level from slipping, tripping and stumbling without subsequent striking against object, initial encounter: Secondary | ICD-10-CM | POA: Diagnosis not present

## 2020-10-17 DIAGNOSIS — Z87891 Personal history of nicotine dependence: Secondary | ICD-10-CM | POA: Insufficient documentation

## 2020-10-17 DIAGNOSIS — Z79899 Other long term (current) drug therapy: Secondary | ICD-10-CM | POA: Diagnosis not present

## 2020-10-17 DIAGNOSIS — S41101A Unspecified open wound of right upper arm, initial encounter: Secondary | ICD-10-CM | POA: Diagnosis present

## 2020-10-17 DIAGNOSIS — S51811A Laceration without foreign body of right forearm, initial encounter: Secondary | ICD-10-CM | POA: Insufficient documentation

## 2020-10-17 DIAGNOSIS — M79601 Pain in right arm: Secondary | ICD-10-CM

## 2020-10-17 DIAGNOSIS — Z7982 Long term (current) use of aspirin: Secondary | ICD-10-CM | POA: Diagnosis not present

## 2020-10-17 DIAGNOSIS — F0390 Unspecified dementia without behavioral disturbance: Secondary | ICD-10-CM | POA: Diagnosis not present

## 2020-10-17 DIAGNOSIS — Z23 Encounter for immunization: Secondary | ICD-10-CM | POA: Insufficient documentation

## 2020-10-17 DIAGNOSIS — W19XXXA Unspecified fall, initial encounter: Secondary | ICD-10-CM

## 2020-10-17 DIAGNOSIS — S41111A Laceration without foreign body of right upper arm, initial encounter: Secondary | ICD-10-CM

## 2020-10-17 HISTORY — DX: Malignant (primary) neoplasm, unspecified: C80.1

## 2020-10-17 MED ORDER — TETANUS-DIPHTH-ACELL PERTUSSIS 5-2.5-18.5 LF-MCG/0.5 IM SUSY
0.5000 mL | PREFILLED_SYRINGE | Freq: Once | INTRAMUSCULAR | Status: AC
  Administered 2020-10-17: 0.5 mL via INTRAMUSCULAR
  Filled 2020-10-17: qty 0.5

## 2020-10-17 NOTE — ED Notes (Signed)
An After Visit Summary was printed and given to the patient. Discharge instructions given and no further questions at this time.  Pt leaving with pt son.

## 2020-10-17 NOTE — ED Notes (Signed)
Providence Lanius, PA performed wound care on patient.

## 2020-10-17 NOTE — ED Provider Notes (Signed)
[facility] DEPT Provider Note   CSN: [id number] Arrival date & time: 10/17/20  1353     History Chief Complaint  Patient presents with  . Extremity Laceration    Jaime Cox is a 87 y.o. female who presents for evaluation of right arm pain/wound after a mechanical fall that occurred last night.  She reports that the fall was unwitnessed.  She reports she tripped and fell and hit her right arm.  She states she did not hit her head or lose consciousness.  She states that since then, she has had pain to her arm and has a wound.  She does not know when her last tetanus shot was.  She is not on any blood thinners.  She denies any nausea/vomiting, numbness/weakness. She has been able to ambulate since this happened.   The history is provided by the patient.       Past Medical History:  Diagnosis Date  . Anxiety   . Cancer [facility])     Patient Active Problem List   Diagnosis Date Noted  . Abnormal gait 10/01/2020  . Anxiety 10/01/2020  . Unintentional weight loss 10/01/2020  . Thrombocytopenia ([place]) 10/01/2020  . Poor mobility 10/01/2020  . Dementia ([place]) 07/27/2019  . History of fall 07/27/2019  . Non-thrombocytopenic purpura ([place]) 07/27/2019  . Age-related osteoporosis without current pathological fracture 06/13/2019  . Amnesia 06/13/2019  . Atherosclerotic heart disease of native coronary artery without angina pectoris 06/13/2019  . Essential hypertension 06/13/2019  . Hypothyroidism 06/13/2019  . Personal history of other diseases of the circulatory system 06/13/2019    Past Surgical History:  Procedure Laterality Date  . BRAIN SURGERY    . BREAST SURGERY    . EYE SURGERY       OB History   No obstetric history on file.     Family History  Problem Relation Age of Onset  . Colon cancer Neg Hx   . Pancreatic cancer Neg Hx   . Esophageal cancer Neg Hx   . Stomach cancer Neg Hx   . Liver cancer Neg Hx     Social History    Tobacco Use  . Smoking status: Former Research scientist (life sciences)  . Smokeless tobacco: Never Used  Vaping Use  . Vaping Use: Never used  Substance Use Topics  . Alcohol use: Never  . Drug use: Never    Home Medications Prior to Admission medications   Medication Sig Start Date End Date Taking? Authorizing Provider  aspirin 81 MG chewable tablet Chew 81 mg by mouth daily.    [provider]  buPROPion (WELLBUTRIN) 75 MG tablet Take 75 mg by mouth 2 (two) times daily. 05/13/20   [provider]  carvedilol (COREG) 3.125 MG tablet Take 3.125 mg by mouth 2 (two) times daily with a meal. 05/13/20   [provider]  levothyroxine (SYNTHROID) 75 MCG tablet Take 75 mcg by mouth daily before breakfast.    [provider]  LORazepam (ATIVAN) 0.5 MG tablet Take 2 tablets (1 mg total) by mouth every 8 (eight) hours as needed for anxiety. 07/14/20   Daleen Bo, MD  ondansetron (ZOFRAN) 4 MG tablet Take 1 tablet (4 mg total) by mouth every 8 (eight) hours as needed for nausea or vomiting (or gagging). 10/01/20   Esterwood, Amy S, PA-C  pantoprazole (PROTONIX) 40 MG tablet Take 1 tablet (40 mg total) by mouth daily before breakfast. 10/01/20   Esterwood, Amy S, PA-C  ranolazine (RANEXA) 500 MG  12 hr tablet Take 500 mg by mouth 2 (two) times daily.    [provider]  rosuvastatin (CRESTOR) 5 MG tablet Take 5 mg by mouth daily.    [provider]  sucralfate (CARAFATE) 1 g tablet Take 1 tablet (1 g total) by mouth 2 (two) times daily with a meal. 09/10/20   Alfredia Client, PA-C    Allergies    Codeine  Review of Systems   Review of Systems  Gastrointestinal: Negative for nausea and vomiting.  Musculoskeletal:       Right arm pain  Skin: Positive for wound.  Neurological: Negative for weakness and numbness.  All other systems reviewed and are negative.   Physical Exam Updated Vital Signs BP 139/76 (BP Location: Left Arm)   Pulse (!) 58   Temp 97.6 F (36.4  C) (Oral)   Resp 16   Ht 5' 3.5" (1.613 m)   Wt 51.3 kg   SpO2 99%   BMI 19.70 kg/m   Physical Exam Vitals and nursing note reviewed.  Constitutional:      Appearance: She is well-developed.  HENT:     Head: Normocephalic and atraumatic.     Comments: No tenderness to palpation of skull. No deformities or crepitus noted. No open wounds, abrasions or lacerations.  Eyes:     General: No scleral icterus.       Right eye: No discharge.        Left eye: No discharge.     Conjunctiva/sclera: Conjunctivae normal.     Comments: Cataract noted to the right eye. Left pupil reactive.   Neck:     Comments: Full flexion/extension and lateral movement of neck fully intact. No bony midline tenderness. No deformities or crepitus.  Cardiovascular:     Pulses:          Radial pulses are 2+ on the right side and 2+ on the left side.  Pulmonary:     Effort: Pulmonary effort is normal.     Comments: Lungs clear to auscultation bilaterally.  Symmetric chest rise.  No wheezing, rales, rhonchi.  Musculoskeletal:     Comments: Tenderness to palpation noted to the right forearm with a small area of hematoma, small curvilinear 1 cm laceration noted.  No deformity or crepitus noted.  Flexion/tension of both the wrist and the elbow intact.  No bony tenderness noted to the right shoulder.  No tenderness palpation of the left upper extremity   Skin:    General: Skin is warm and dry.     Capillary Refill: Capillary refill takes less than 2 seconds.     Comments: Good distal cap refill.  RUE is not dusky in appearance or cool to touch.  Neurological:     Mental Status: She is alert.     Comments: Cranial nerves III-XII intact Follows commands, Moves all extremities  5/5 strength to BUE and BLE  Sensation intact throughout all major nerve distributions No slurred speech. No facial droop.  She is alert and oriented to person and place but she does not tell me what year it is.  Psychiatric:        Speech:  Speech normal.        Behavior: Behavior normal.     ED Results / Procedures / Treatments   Labs (all labs ordered are listed, but only abnormal results are displayed) Labs Reviewed - No data to display  EKG None  Radiology DG Forearm Right  Result Date: 10/17/2020 CLINICAL DATA:  Arm pain, laceration, fall EXAM: RIGHT FOREARM - 2 VIEW COMPARISON:  None. FINDINGS: Plate and screw fixation noted in the distal radius of across remote fracture. No acute fracture, subluxation or dislocation. Degenerative changes at the radiocarpal joint and 1st carpometacarpal joint. No radiopaque foreign body. IMPRESSION: No acute bony abnormality. Electronically Signed   By: Rolm Baptise M.D.   On: 10/17/2020 15:14    Procedures .Marland KitchenLaceration Repair  Date/Time: 10/17/2020 3:38 PM Performed by: Volanda Napoleon, PA-C Authorized by: Volanda Napoleon, PA-C   Consent:    Consent obtained:  Verbal   Consent given by:  Patient   Risks discussed:  Infection, need for additional repair, pain, poor cosmetic result and poor wound healing   Alternatives discussed:  No treatment and delayed treatment Universal protocol:    Procedure explained and questions answered to patient or proxy's satisfaction: yes     Relevant documents present and verified: yes     Test results available: yes     Imaging studies available: yes     Required blood products, implants, devices, and special equipment available: yes     Site/side marked: yes     Immediately prior to procedure, a time out was called: yes     Patient identity confirmed:  Verbally with patient Anesthesia:    Anesthesia method:  None Laceration details:    Location:  Shoulder/arm   Shoulder/arm location:  R lower arm   Length (cm):  1 Pre-procedure details:    Preparation:  Patient was prepped and draped in usual sterile fashion Exploration:    Limited defect created (wound extended): no     Hemostasis achieved with:  Direct pressure   Imaging  outcome: foreign body not noted     Wound exploration: wound explored through full range of motion   Treatment:    Area cleansed with:  Povidone-iodine   Amount of cleaning:  Extensive   Irrigation solution:  Sterile saline   Visualized foreign bodies/material removed: no     Debridement:  None Skin repair:    Repair method:  Steri-Strips   Number of Steri-Strips:  2 Approximation:    Approximation:  Close Repair type:    Repair type:  Simple Post-procedure details:    Dressing:  Non-adherent dressing   Procedure completion:  Tolerated     Medications Ordered in ED Medications  Tdap (BOOSTRIX) injection 0.5 mL (0.5 mLs Intramuscular Given 10/17/20 1454)    ED Course  I have reviewed the triage vital signs and the nursing notes.  Pertinent labs & imaging results that were available during my care of the patient were reviewed by me and considered in my medical decision making (see chart for details).    MDM Rules/Calculators/A&P                          87 year old female who presents for evaluation of right arm pain after mechanical fall.  Reports he also has a laceration.  She states that she tripped and fell last night.  She states she did not hit her head or lose consciousness.  She is not on blood thinners.  On initial arrival, she is afebrile nontoxic-appearing.  Vital signs are stable.  On exam, she has an area of tenderness, hematoma, laceration to the right forearm.  She has good pulses, cap refill.  She has no evidence of neuro deficits on exam or no evidence of head trauma.  She is alert and oriented  x2 but cannot tell me what year it is.  We will plan for Tdap, x-ray of arm, wound care.  Initially, patient was by herself when I evaluated her and given that the concerns that she cannot tell her what year it is, I had ordered a head CT.  Patient states she did not hit her head or lose consciousness and is not on blood thinners.  Son later came back to the room and  confirmed that patient is at her baseline and this is normal for her.  We discussed the utility of ordering a CT head.  Patient's is adamant that she did not hit her head or lose consciousness and son states she is at normal baseline.  On my evaluation, she has no neurodeficits.  After engaging in shared decision-making with both patient and son, the decision was made to forego the CT head.  Patient and son are agreeable.  X-ray of arm shows no acute abnormalities.  I washed the wound and cleaned it with sterile saline.  I discussed with him that given my concerns that this laceration was greater than 12 hours, the concern for potential infection and the risk of it becoming worse would outweigh the benefit of closing it at this time.  Patient and son are agreeable.  It was cleaned and dressed with Steri-Strips. At this time, patient exhibits no emergent life-threatening condition that require further evaluation in ED. Patient had ample opportunity for questions and discussion. All patient's questions were answered with full understanding. Strict return precautions discussed. Patient expresses understanding and agreement to plan.   Portions of this note were generated with Lobbyist. Dictation errors may occur despite best attempts at proofreading.  Final Clinical Impression(s) / ED Diagnoses Final diagnoses:  Fall, initial encounter  Pain of right upper extremity  Laceration of right upper extremity, initial encounter    Rx / DC Orders ED Discharge Orders    None       Desma Mcgregor 10/17/20 2148    Lucrezia Starch, MD 10/17/20 2222

## 2020-10-17 NOTE — Discharge Instructions (Signed)
Keep the wound clean and dry for the first 24 hours. After that you may gently clean the wound with soap and water. Make sure to pat dry the wound before covering it with any dressing. You can use topical antibiotic ointment and bandage. Ice and elevate for pain relief.   You can take Tylenol or Ibuprofen as directed for pain. You can alternate Tylenol and Ibuprofen every 4 hours for additional pain relief.   Monitor closely for any signs of infection. Return to the Emergency Department for any worsening redness/swelling of the area that begins to spread, drainage from the site, worsening pain, fever or any other worsening or concerning symptoms.

## 2020-10-17 NOTE — ED Triage Notes (Signed)
Patient states the light was off and she tripped while going to the bathroom paitent has a small laceration to the right anterior forearm. No bleeding at this time. Patient denies hitting her head.

## 2020-10-21 NOTE — Progress Notes (Signed)
Reviewed and agree with documentation and assessment and plan. K. Veena Ameet Sandy , MD   

## 2020-11-04 ENCOUNTER — Emergency Department (HOSPITAL_COMMUNITY)
Admission: EM | Admit: 2020-11-04 | Discharge: 2020-11-04 | Disposition: A | Discharge status: Home / Self Care | Payer: Medicare Other | Attending: Emergency Medicine | Admitting: Emergency Medicine

## 2020-11-04 ENCOUNTER — Encounter (HOSPITAL_COMMUNITY): Payer: Self-pay

## 2020-11-04 ENCOUNTER — Other Ambulatory Visit: Payer: Self-pay

## 2020-11-04 DIAGNOSIS — Z5321 Procedure and treatment not carried out due to patient leaving prior to being seen by health care provider: Secondary | ICD-10-CM | POA: Diagnosis not present

## 2020-11-04 DIAGNOSIS — R109 Unspecified abdominal pain: Secondary | ICD-10-CM | POA: Insufficient documentation

## 2020-11-04 LAB — CBC
HCT: 39.6 % (ref 36.0–46.0)
Hemoglobin: 13.1 g/dL (ref 12.0–15.0)
MCH: 33.9 pg (ref 26.0–34.0)
MCHC: 33.1 g/dL (ref 30.0–36.0)
MCV: 102.3 fL — ABNORMAL HIGH (ref 80.0–100.0)
Platelets: 121 10*3/uL — ABNORMAL LOW (ref 150–400)
RBC: 3.87 MIL/uL (ref 3.87–5.11)
RDW: 12.5 % (ref 11.5–15.5)
WBC: 7.4 10*3/uL (ref 4.0–10.5)
nRBC: 0 % (ref 0.0–0.2)

## 2020-11-04 LAB — LIPASE, BLOOD: Lipase: 42 U/L (ref 11–51)

## 2020-11-04 LAB — COMPREHENSIVE METABOLIC PANEL
ALT: 21 U/L (ref 0–44)
AST: 29 U/L (ref 15–41)
Albumin: 3.4 g/dL — ABNORMAL LOW (ref 3.5–5.0)
Alkaline Phosphatase: 178 U/L — ABNORMAL HIGH (ref 38–126)
Anion gap: 6 (ref 5–15)
BUN: 20 mg/dL (ref 8–23)
CO2: 28 mmol/L (ref 22–32)
Calcium: 8.8 mg/dL — ABNORMAL LOW (ref 8.9–10.3)
Chloride: 104 mmol/L (ref 98–111)
Creatinine, Ser: 0.8 mg/dL (ref 0.44–1.00)
GFR, Estimated: >60 mL/min (ref >60)
Glucose, Bld: 200 mg/dL — ABNORMAL HIGH (ref 70–99)
Potassium: 3.6 mmol/L (ref 3.5–5.1)
Sodium: 138 mmol/L (ref 135–145)
Total Bilirubin: 0.8 mg/dL (ref 0.3–1.2)
Total Protein: 6.7 g/dL (ref 6.5–8.1)

## 2020-11-04 NOTE — ED Provider Notes (Signed)
Emergency Medicine Provider Triage Evaluation Note  Jaime Cox , a 87 y.o. female  was evaluated in triage.  Pt complains of abdominal pain.  Pain began either last night or this morning.  She reports pain is localized to left lower quadrant.  Associated nausea.  No fevers, chest pain, shortness of breath, vomiting, urinary symptoms, abnormal bowel movements.  She took something for pain, does not remember what it was.  She denies history of similar.  Review of Systems  Positive: Nausea, left lower quadrant abdominal pain Negative: Fever, abnormal bowel movements, urinary symptoms  Physical Exam  BP 120/73 (BP Location: Left Arm)   Pulse (!) 52   Temp 98 F (36.7 C) (Oral)   Resp 16   Ht 5' 3.5" (1.613 m)   Wt 51.3 kg   SpO2 94%   BMI 19.70 kg/m  Gen:   Awake, no distress Resp:  Normal effort  MSK:   Moves extremities without difficulty  Other:  TTP of left lower quadrant abdomen.  No CVA tenderness.  Minimal tenderness palpation of the left upper quadrant abdomen.  No tenderness palpation elsewhere in the abdomen.  No rigidity, guarding, distention  Medical Decision Making  Medically screening exam initiated at 1:15 PM.  Appropriate orders placed.  Jaime Cox was informed that the remainder of the evaluation will be completed by another provider, this initial triage assessment does not replace that evaluation, and the importance of remaining in the ED until their evaluation is complete.  Left lower quadrant tenderness.  Consider diverticulitis versus constipation versus enteritis versus colitis versus kidney stone versus UTI.  Labs, urine, CT ordered   Franchot Heidelberg, PA-C 11/04/20 1316    Valarie Merino, MD 11/06/20 [phone]

## 2020-11-04 NOTE — ED Notes (Signed)
Pt stating "it takes 5 hours to get seen by a doctor around here. I called my son and am leaving." This RN trying to ensure pt that she will be seen by a doctor. Pt not listening and walking to lobby.

## 2020-11-04 NOTE — ED Triage Notes (Signed)
Patient c/o left abdominal pain since last night. Patient denies any N/v/D.

## 2020-12-16 ENCOUNTER — Emergency Department (HOSPITAL_COMMUNITY)
Admission: EM | Admit: 2020-12-16 | Discharge: 2020-12-17 | Disposition: A | Discharge status: Home / Self Care | Payer: Medicare Other | Attending: Emergency Medicine | Admitting: Emergency Medicine

## 2020-12-16 ENCOUNTER — Other Ambulatory Visit: Payer: Self-pay

## 2020-12-16 ENCOUNTER — Encounter (HOSPITAL_COMMUNITY): Payer: Self-pay

## 2020-12-16 DIAGNOSIS — R531 Weakness: Secondary | ICD-10-CM | POA: Insufficient documentation

## 2020-12-16 DIAGNOSIS — R519 Headache, unspecified: Secondary | ICD-10-CM | POA: Diagnosis present

## 2020-12-16 DIAGNOSIS — R112 Nausea with vomiting, unspecified: Secondary | ICD-10-CM | POA: Insufficient documentation

## 2020-12-16 DIAGNOSIS — Z5321 Procedure and treatment not carried out due to patient leaving prior to being seen by health care provider: Secondary | ICD-10-CM | POA: Diagnosis not present

## 2020-12-16 NOTE — ED Triage Notes (Signed)
Patient c/o nausea and poor intake X 2 weeks. 2 episodes of emesis   C/o headache.    A/o at baseline  Wheelchair

## 2020-12-16 NOTE — ED Provider Notes (Signed)
Emergency Medicine Provider Triage Evaluation Note  Jaime Cox , a 87 y.o. female  was evaluated in triage.  Pt complains of fatigue, nausea, vomiting, and poor oral intake.  Patient reports that she has had symptoms over the last 2 weeks.  Patient reports vomiting 5 times in the last 24 hours.  Patient denies any hematemesis or coffee-ground emesis.  Patient endorses subjective fevers, cough, and occasional constipation.  Patient denies any abdominal pain, diarrhea, rhinorrhea, nasal congestion, sore throat, chest pain or shortness of breath.  Patient denies any known sick contacts.  Patient has received COVID-19 vaccination.  Review of Systems  Positive: Subjective fevers, nausea, vomiting, fatigue, cough Negative: Abdominal pain, melena, blood in stool, dysuria, hematuria, vaginal pain, vaginal bleeding, vaginal discharge  Physical Exam  BP 133/62   Pulse 61   Temp 97.9 F (36.6 C) (Oral)   Resp 18   SpO2 100%  Gen:   Awake, no distress   Resp:  Normal effort, lungs clear to auscultation bilaterally MSK:   Moves extremities without difficulty  Other:  Abdomen soft, nondistended.  Tenderness to left upper quadrant.    Medical Decision Making  Medically screening exam initiated at 3:48 PM.  Appropriate orders placed.  Jaime Cox was informed that the remainder of the evaluation will be completed by another provider, this initial triage assessment does not replace that evaluation, and the importance of remaining in the ED until their evaluation is complete.  The patient appears stable so that the remainder of the work up may be completed by another provider.      Dyann Ruddle 12/16/20 1551    Carmin Muskrat, MD 12/17/20 0900

## 2020-12-24 ENCOUNTER — Other Ambulatory Visit: Payer: Self-pay | Admitting: Internal Medicine

## 2020-12-24 DIAGNOSIS — R634 Abnormal weight loss: Secondary | ICD-10-CM

## 2021-01-20 ENCOUNTER — Other Ambulatory Visit: Payer: Self-pay | Admitting: Internal Medicine

## 2021-01-20 DIAGNOSIS — R634 Abnormal weight loss: Secondary | ICD-10-CM

## 2021-02-11 ENCOUNTER — Ambulatory Visit
Admission: RE | Admit: 2021-02-11 | Discharge: 2021-02-11 | Disposition: A | Discharge status: Home / Self Care | Payer: Medicare Other | Source: Ambulatory Visit | Attending: Internal Medicine | Admitting: Internal Medicine

## 2021-02-11 ENCOUNTER — Other Ambulatory Visit: Payer: Self-pay

## 2021-02-11 DIAGNOSIS — R634 Abnormal weight loss: Secondary | ICD-10-CM

## 2021-02-11 MED ORDER — IOPAMIDOL (ISOVUE-300) INJECTION 61%
100.0000 mL | Freq: Once | INTRAVENOUS | Status: AC | PRN
  Administered 2021-02-11: 100 mL via INTRAVENOUS

## 2021-04-27 IMAGING — DX DG FOREARM 2V*R*
2 series · 2 of 2 positions shown · non-contrast
Comparison: None.

CLINICAL DATA: Arm pain, laceration, fall

EXAM:
RIGHT FOREARM - 2 VIEW

[forearm ap]
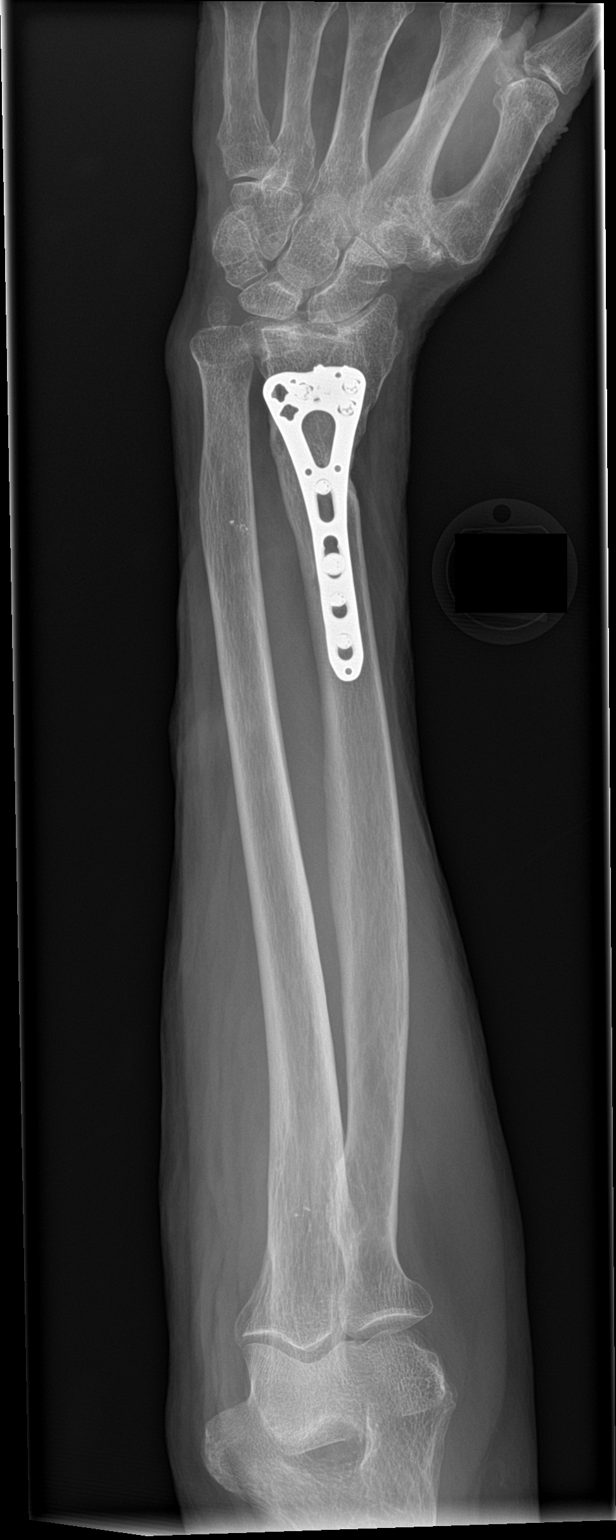

[forearm lat]
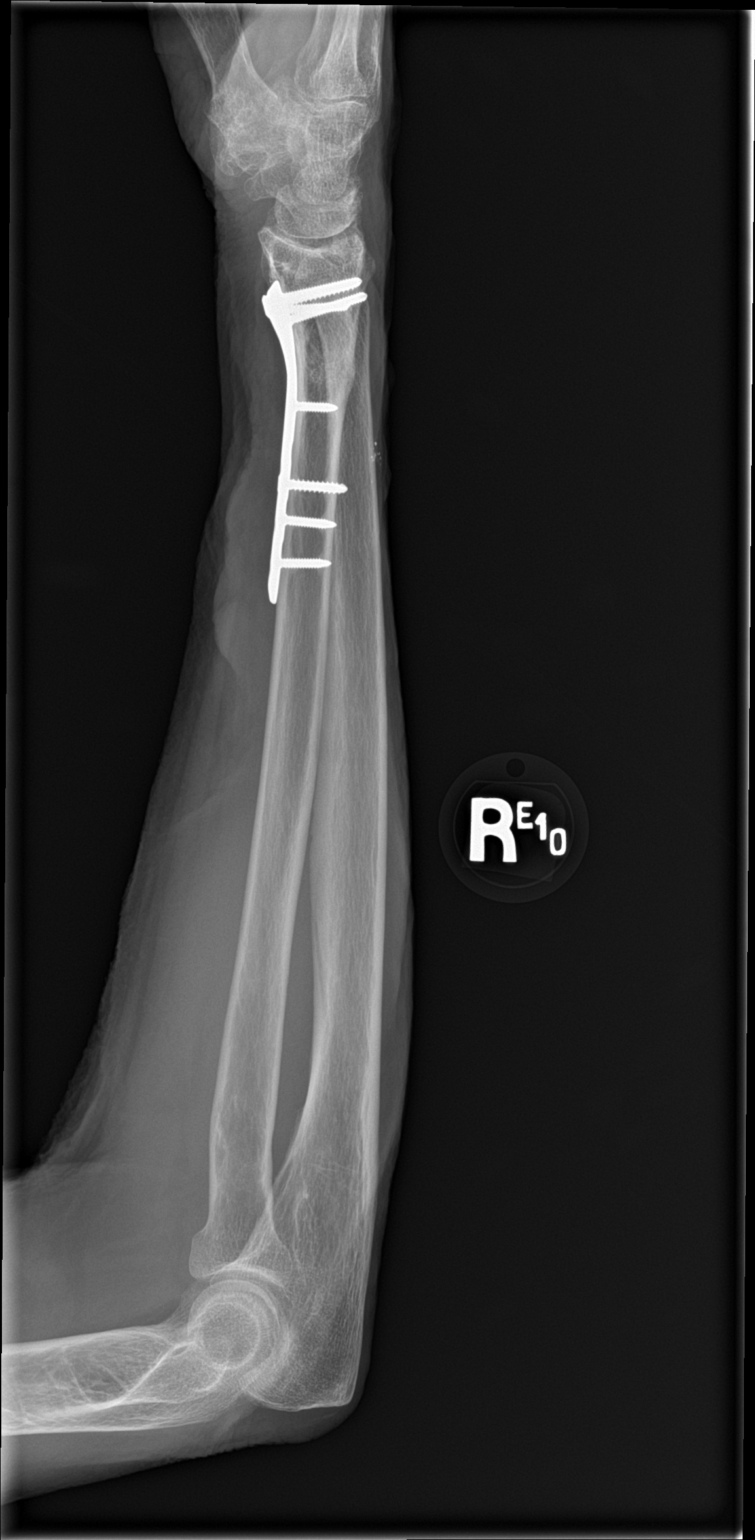

[2 of 2 positions shown; findings below may reference images not displayed]

FINDINGS: Plate and screw fixation noted in the distal radius of across remote
fracture. No acute fracture, subluxation or dislocation.
Degenerative changes at the radiocarpal joint and 1st
carpometacarpal joint. No radiopaque foreign body.
IMPRESSION: No acute bony abnormality.

## 2024-02-26 ENCOUNTER — Other Ambulatory Visit: Payer: Self-pay

## 2024-02-26 ENCOUNTER — Emergency Department (HOSPITAL_COMMUNITY)

## 2024-02-26 ENCOUNTER — Emergency Department (HOSPITAL_COMMUNITY)
Admission: EM | Admit: 2024-02-26 | Discharge: 2024-02-26 | Discharge status: Against Medical Advice | Attending: Emergency Medicine | Admitting: Emergency Medicine

## 2024-02-26 ENCOUNTER — Encounter (HOSPITAL_COMMUNITY): Payer: Self-pay | Admitting: Emergency Medicine

## 2024-02-26 DIAGNOSIS — Z7982 Long term (current) use of aspirin: Secondary | ICD-10-CM | POA: Diagnosis not present

## 2024-02-26 DIAGNOSIS — R112 Nausea with vomiting, unspecified: Secondary | ICD-10-CM | POA: Diagnosis not present

## 2024-02-26 DIAGNOSIS — R1013 Epigastric pain: Secondary | ICD-10-CM | POA: Diagnosis not present

## 2024-02-26 DIAGNOSIS — D696 Thrombocytopenia, unspecified: Secondary | ICD-10-CM | POA: Insufficient documentation

## 2024-02-26 DIAGNOSIS — F0390 Unspecified dementia without behavioral disturbance: Secondary | ICD-10-CM | POA: Insufficient documentation

## 2024-02-26 DIAGNOSIS — Z5329 Procedure and treatment not carried out because of patient's decision for other reasons: Secondary | ICD-10-CM | POA: Diagnosis not present

## 2024-02-26 DIAGNOSIS — Z79899 Other long term (current) drug therapy: Secondary | ICD-10-CM | POA: Diagnosis not present

## 2024-02-26 DIAGNOSIS — E039 Hypothyroidism, unspecified: Secondary | ICD-10-CM | POA: Diagnosis not present

## 2024-02-26 DIAGNOSIS — I10 Essential (primary) hypertension: Secondary | ICD-10-CM | POA: Diagnosis not present

## 2024-02-26 DIAGNOSIS — R1033 Periumbilical pain: Secondary | ICD-10-CM | POA: Diagnosis not present

## 2024-02-26 DIAGNOSIS — R1110 Vomiting, unspecified: Secondary | ICD-10-CM | POA: Diagnosis present

## 2024-02-26 LAB — CBC WITH DIFFERENTIAL/PLATELET
Abs Immature Granulocytes: 0.01 K/uL (ref 0.00–0.07)
Basophils Absolute: 0 K/uL (ref 0.0–0.1)
Basophils Relative: 0 %
Eosinophils Absolute: 0.1 K/uL (ref 0.0–0.5)
Eosinophils Relative: 2 %
HCT: 39.9 % (ref 36.0–46.0)
Hemoglobin: 13 g/dL (ref 12.0–15.0)
Immature Granulocytes: 0 %
Lymphocytes Relative: 23 %
Lymphs Abs: 1.1 K/uL (ref 0.7–4.0)
MCH: 32 pg (ref 26.0–34.0)
MCHC: 32.6 g/dL (ref 30.0–36.0)
MCV: 98.3 fL (ref 80.0–100.0)
Monocytes Absolute: 0.5 K/uL (ref 0.1–1.0)
Monocytes Relative: 11 %
Neutro Abs: 3 K/uL (ref 1.7–7.7)
Neutrophils Relative %: 64 %
Platelets: 96 K/uL — ABNORMAL LOW (ref 150–400)
RBC: 4.06 MIL/uL (ref 3.87–5.11)
RDW: 12.7 % (ref 11.5–15.5)
WBC: 4.7 K/uL (ref 4.0–10.5)
nRBC: 0 % (ref 0.0–0.2)

## 2024-02-26 LAB — COMPREHENSIVE METABOLIC PANEL WITH GFR
ALT: 28 U/L (ref 0–44)
AST: 43 U/L — ABNORMAL HIGH (ref 15–41)
Albumin: 4.2 g/dL (ref 3.5–5.0)
Alkaline Phosphatase: 177 U/L — ABNORMAL HIGH (ref 38–126)
Anion gap: 11 (ref 5–15)
BUN: 12 mg/dL (ref 8–23)
CO2: 23 mmol/L (ref 22–32)
Calcium: 9.3 mg/dL (ref 8.9–10.3)
Chloride: 105 mmol/L (ref 98–111)
Creatinine, Ser: 0.76 mg/dL (ref 0.44–1.00)
GFR, Estimated: >60 mL/min (ref >60)
Glucose, Bld: 150 mg/dL — ABNORMAL HIGH (ref 70–99)
Potassium: 3.9 mmol/L (ref 3.5–5.1)
Sodium: 139 mmol/L (ref 135–145)
Total Bilirubin: 1.1 mg/dL (ref 0.0–1.2)
Total Protein: 6.8 g/dL (ref 6.5–8.1)

## 2024-02-26 LAB — LIPASE, BLOOD: Lipase: 27 U/L (ref 11–51)

## 2024-02-26 LAB — LACTIC ACID, PLASMA: Lactic Acid, Venous: 1 mmol/L (ref 0.5–1.9)

## 2024-02-26 MED ORDER — ONDANSETRON 4 MG PO TBDP
4.0000 mg | ORAL_TABLET | Freq: Once | ORAL | Status: AC | PRN
  Administered 2024-02-26: 4 mg via ORAL
  Filled 2024-02-26: qty 1

## 2024-02-26 MED ORDER — ALPRAZOLAM 0.25 MG PO TABS: 0.2500 mg | ORAL_TABLET | ORAL | 0 refills | Status: AC | PRN

## 2024-02-26 MED ORDER — ALPRAZOLAM 0.25 MG PO TABS
0.2500 mg | ORAL_TABLET | Freq: Once | ORAL | Status: AC
  Administered 2024-02-26: 0.25 mg via ORAL
  Filled 2024-02-26: qty 1

## 2024-02-26 MED ORDER — ONDANSETRON HCL 4 MG PO TABS: 4.0000 mg | ORAL_TABLET | Freq: Three times a day (TID) | ORAL | Status: AC | PRN

## 2024-02-26 NOTE — ED Provider Notes (Signed)
 Jaime Cox EMERGENCY DEPARTMENT AT [facility] Provider Note   CSN: [id number] Arrival date & time: 02/26/24  1508     History Chief Complaint  Patient presents with   Emesis   Abdominal Pain   Diarrhea   Back Pain   HPI: Jaime Cox is a 89 y.o. female with history pertinent for dementia, HTN, hypothyroidism, osteoporosis who presents complaining of abdominal pain, emesis. Patient arrived via POV accompanied by son.  History provided by patient and relative: Son.  No interpreter required during this encounter.  Patient and son report that patient typically lives in [place], however has been staying with son, Jaime Cox, in [place]  for approximately 1 month while his sister, Jaime Cox is taking care of of other necessities back home in [place].  Reports that the patient is scheduled to fly back to [facility] tomorrow, however today patient complained of severe abdominal pain radiating to her back which was associated with emesis.  Patient has also had loose stool.  Patient and son deny fever, chills, chest pain, shortness of breath.  Reports that symptoms have resolved spontaneously, and patient was complaining of hunger, therefore her son gave her a hamburger just prior to my evaluation.  Son request that patient be prescribed medication such that she is able to fly back to [facility] without symptoms.  Patient's recorded medical, surgical, social, medication list and allergies were reviewed in the Snapshot window as part of the initial history.   Prior to Admission medications   Medication Sig Start Date End Date Taking? Authorizing Provider  ALPRAZolam  (XANAX ) 0.25 MG tablet Take 1 tablet (0.25 mg total) by mouth as needed for up to 1 dose for anxiety. 02/26/24  Yes Rogelia Jerilynn RAMAN, MD  ondansetron  (ZOFRAN ) 4 MG tablet Take 1 tablet (4 mg total) by mouth every 8 (eight) hours as needed for nausea or vomiting. 02/26/24  Yes Rogelia Jerilynn RAMAN, MD  aspirin 81 MG chewable  tablet Chew 81 mg by mouth daily.    [provider]  buPROPion (WELLBUTRIN) 75 MG tablet Take 75 mg by mouth 2 (two) times daily. 05/13/20   [provider]  carvedilol (COREG) 3.125 MG tablet Take 3.125 mg by mouth 2 (two) times daily with a meal. 05/13/20   [provider]  levothyroxine  (SYNTHROID ) 75 MCG tablet Take 75 mcg by mouth daily before breakfast.    [provider]  LORazepam  (ATIVAN ) 0.5 MG tablet Take 2 tablets (1 mg total) by mouth every 8 (eight) hours as needed for anxiety. 07/14/20   Lorriane Holmes, MD  ondansetron  (ZOFRAN ) 4 MG tablet Take 1 tablet (4 mg total) by mouth every 8 (eight) hours as needed for nausea or vomiting (or gagging). 10/01/20   Esterwood, Amy S, PA-C  pantoprazole  (PROTONIX ) 40 MG tablet Take 1 tablet (40 mg total) by mouth daily before breakfast. 10/01/20   Esterwood, Amy S, PA-C  ranolazine (RANEXA) 500 MG 12 hr tablet Take 500 mg by mouth 2 (two) times daily.    [provider]  rosuvastatin (CRESTOR) 5 MG tablet Take 5 mg by mouth daily.    [provider]  sucralfate  (CARAFATE ) 1 g tablet Take 1 tablet (1 g total) by mouth 2 (two) times daily with a meal. 09/10/20   Tobie Schatz, PA-C     Allergies: Codeine   Review of Systems   ROS as per HPI  Physical Exam Updated Vital Signs BP (!) 171/66   Pulse (!) 54  Temp 97.8 F (36.6 C) (Oral)   Resp 20   SpO2 100%  Physical Exam Vitals and nursing note reviewed.  Constitutional:      General: She is not in acute distress.    Appearance: Normal appearance. She is well-developed.  HENT:     Head: Normocephalic and atraumatic.  Eyes:     Extraocular Movements: Extraocular movements intact.     Conjunctiva/sclera: Conjunctivae normal.  Cardiovascular:     Rate and Rhythm: Normal rate and regular rhythm.     Pulses: Normal pulses.     Heart sounds: No murmur heard. Pulmonary:     Effort: Pulmonary effort is normal. No respiratory distress.      Breath sounds: Normal breath sounds.  Abdominal:     Palpations: Abdomen is soft.     Tenderness: There is abdominal tenderness in the epigastric area and periumbilical area.  Musculoskeletal:        General: No swelling.     Cervical back: Neck supple.  Skin:    General: Skin is warm and dry.     Capillary Refill: Capillary refill takes less than 2 seconds.  Neurological:     General: No focal deficit present.     Mental Status: She is alert and oriented to person, place, and time.  Psychiatric:        Mood and Affect: Mood normal.     ED Course/ Medical Decision Making/ A&P  Procedures Procedures   Medications Ordered in ED Medications  ondansetron  (ZOFRAN -ODT) disintegrating tablet 4 mg (4 mg Oral Given 02/26/24 1925)  ALPRAZolam  (XANAX ) tablet 0.25 mg (0.25 mg Oral Given 02/26/24 1936)    Medical Decision Making:   Jaime Cox is a 89 y.o. female who presents for abdominal pain and emesis as per above.  Physical exam is pertinent for epigastric and periumbilical abdominal tenderness to palpation.   The differential includes but is not limited to bowel obstruction, gastritis, gastroenteritis, mesenteric ischemia, AKI, electrolyte derangement, pancreatitis, perforation, cholecystitis.  Independent historian: Relative: Son, daughter  External data reviewed: EKG  Initial Plan:   Screening labs including CBC and Metabolic panel to evaluate for infectious or metabolic etiology of disease.  Urinalysis with reflex culture ordered to evaluate for UTI or relevant urologic/nephrologic pathology.  Lactic acid to evaluate for mesenteric ischemia, sepsis, shock CT abdomen pelvis to evaluate for structural/infectious intra abdominal pathology.  EKG to evaluate for cardiac pathology Objective evaluation as below reviewed   Labs: Ordered, Independent interpretation, and Details: Lactic acid WNL.  CBC without leukocytosis, anemia, mild thrombocytopenia, similar comparison to prior.   CMP without AKI, emergent electrolyte derangement, emergent LFT abnormality, lipase WNL.  UA not obtained.  Radiology: Ordered No results found.  EKG/Medicine tests: Ordered and Independent interpretation EKG Interpretation: Junctional rhythm LVH with secondary repolarization abnormality Anterior infarct, old Baseline wander in lead(s) V3 V4 V6 Within limitations of baseline wander, no focal ST/T changes.  Overall similar in comparison to prior Confirmed by Rogelia Satterfield ([id number]) on 02/27/2024 1:22:57 AM                Interventions: Zofran , Xanax   See the EMR for full details regarding lab and imaging results.  Patient presents for abdominal pain and vomiting.  By the time of my evaluation, patient had spontaneous resolution of reported pain, though had persistent abdominal tenderness to palpation without guarding or rebound.  Patient was tolerating p.o. by the time of my evaluation, however given age, limited history from patient due to  dementia, presence of repeated emesis, do feel that patient warrants labs as well as imaging.  Labs were able to be obtained and overall reassuring, no leukocytosis to suggest severe inflammatory or infectious etiology, though this response can be blunted in an elderly patient.  No AKI, major electrolyte changing, significant LFT elevation reality, no right upper quadrant tenderness, cholecystitis less likely.  Lipase WNL, therefore pancreatitis less likely.  Lactic acid WNL, therefore doubt mesenteric ischemia, sepsis and shock less likely.   While patient in the ED, she did have recurrence of nausea and vomiting, further increasing desire to obtain CT of the abdomen pelvis to rule out emergent intra-abdominal pathology.  Daughter presented to bedside, states that patient was becoming more agitated as the day was continuing, and requested that patient be provided a dose of her home Xanax .  Given this is reportedly a home medication, this is not unreasonable.   Single dose of Xanax  administrated addition to Zofran .  Family thereafter expressed desire to be discharged.  I had extensive shared decision-making at the bedside with patient, daughter Signe), son Carolan).  Discussed that patient has ongoing abdominal tenderness and active nausea and vomiting, and with her age she is at risk for serious intra-abdominal pathologies, which require further evaluation by CT, and lack of completion of this care could result in worsening of patient condition, permanent disability, death.  Discussed that even with discharge currently, patient current condition is not amenable to flying back to [facility].  Ultimately patient continues to desire to go home, and children at bedside would like to respect patient's wishes, and states that they will return in the a.m.  Written prescription for single dose of outpatient Xanax  to help with patient's baseline intermittent agitation to hopefully facilitate her return to the emergency department, as well as dose of Zofran .  Reiterated that patient is not appropriate to fly back to [facility] until she has been fully evaluated including with advanced imaging, reiterated risks of discharging AMA, ultimately family discharged to home AMA.  Presentation is most consistent with acute complicated illness  Discussion of management or test interpretations with external provider(s): None by the time of discharge AMA  Risk Drugs:Prescription drug management  Disposition: AMA  MDM generated using voice dictation software and may contain dictation errors.  Please contact me for any clarification or with any questions.  Clinical Impression:  1. Nausea and vomiting, unspecified vomiting type      AMA   Final Clinical Impression(s) / ED Diagnoses Final diagnoses:  Nausea and vomiting, unspecified vomiting type    Rx / DC Orders ED Discharge Orders          Ordered    ALPRAZolam  (XANAX ) 0.25 MG tablet  As needed        02/26/24 2010     ondansetron  (ZOFRAN ) 4 MG tablet  Every 8 hours PRN        02/26/24 2010             Rogelia Jerilynn RAMAN, MD 02/27/24 [id number]

## 2024-02-26 NOTE — ED Provider Triage Note (Signed)
 Emergency Medicine Provider Triage Evaluation Note  Jaime Cox , a 89 y.o. female  was evaluated in triage.  Pt complains of periumbilical pain starting acutely today, 2 hours ago. Pain radiates to back. Described as sharp.  Endorses nausea.   Denies fever, headaches, vision changes, chest pain, shortness of breath, melena, hematochezia, dysuria.   Review of Systems  Positive: N/a Negative: N/a  Physical Exam  BP (!) 192/72 (BP Location: Right Arm)   Pulse (!) 54   Temp 98.3 F (36.8 C) (Oral)   Resp (!) 21   SpO2 100%  Gen:   Awake, no distress   Resp:  Normal effort  MSK:   Moves extremities without difficulty  Other:    Medical Decision Making  Medically screening exam initiated at 3:26 PM.  Appropriate orders placed.  JOZLYNN PLAIA was informed that the remainder of the evaluation will be completed by another provider, this initial triage assessment does not replace that evaluation, and the importance of remaining in the ED until their evaluation is complete.     Beola Terrall RAMAN, [place] 02/26/24 [phone]

## 2024-02-26 NOTE — Discharge Instructions (Addendum)
 Jaime Cox  Thank you for allowing us  to take care of you today.  You came to the Emergency Department today because you had severe abdominal pain and vomiting.  Here in the emergency department initially you were able to eat a hamburger, however thereafter you are having repeat severe abdominal pain.  Your initial labs are reassuring, however there are many severe diseases that can cause abdominal pain and vomiting in adults.  We wanted to get a CT to further evaluate for these, however he did not want to stay, and after shared decision making with you, and your children, you would like to leave AGAINST MEDICAL ADVICE.  We recommend against flying until he has been fully evaluated, and we recommend returning to the emergency department as soon as possible for continued evaluation.  To-Do: 1. Please return to the emergency department as soon as possible for completion evaluation.   We hope you feel better soon.   Mitzie Later, MD Department of Emergency Medicine [facility]

## 2024-02-26 NOTE — ED Triage Notes (Signed)
 Patient presents due to abdominal pain, vomiting, diarrhea and mid back pain. Patient vomited after her meal this morning. Family reports she has not been drinking enough water lately. Symptoms began about 0600 this am.
